# Patient Record
Sex: Female | Born: 1945 | Hispanic: No | Smoking: Never smoker
Health system: Southern US, Community
[De-identification: ages and names within clinical notes are randomized; demographics above are authoritative.]

## PROBLEM LIST (undated history)

## (undated) DIAGNOSIS — G309 Alzheimer's disease, unspecified: Secondary | ICD-10-CM

## (undated) DIAGNOSIS — F0280 Dementia in other diseases classified elsewhere without behavioral disturbance: Secondary | ICD-10-CM

## (undated) DIAGNOSIS — F0390 Unspecified dementia without behavioral disturbance: Secondary | ICD-10-CM

## (undated) HISTORY — PX: ABDOMINAL HYSTERECTOMY: SHX81

---

## 1999-08-24 ENCOUNTER — Ambulatory Visit (HOSPITAL_COMMUNITY): Admission: RE | Admit: 1999-08-24 | Discharge: 1999-08-24 | Payer: Self-pay | Admitting: Obstetrics & Gynecology

## 2005-08-27 ENCOUNTER — Emergency Department (HOSPITAL_COMMUNITY): Admission: EM | Admit: 2005-08-27 | Discharge: 2005-08-27 | Payer: Self-pay | Admitting: Family Medicine

## 2011-02-28 ENCOUNTER — Other Ambulatory Visit: Payer: Self-pay | Admitting: Internal Medicine

## 2011-02-28 DIAGNOSIS — Z1231 Encounter for screening mammogram for malignant neoplasm of breast: Secondary | ICD-10-CM

## 2011-03-10 ENCOUNTER — Ambulatory Visit
Admission: RE | Admit: 2011-03-10 | Discharge: 2011-03-10 | Disposition: A | Discharge status: Home / Self Care | Payer: Medicare Other | Source: Ambulatory Visit | Attending: Internal Medicine | Admitting: Internal Medicine

## 2011-03-10 DIAGNOSIS — Z1231 Encounter for screening mammogram for malignant neoplasm of breast: Secondary | ICD-10-CM

## 2011-03-14 ENCOUNTER — Other Ambulatory Visit: Payer: Self-pay | Admitting: Internal Medicine

## 2011-03-14 DIAGNOSIS — R928 Other abnormal and inconclusive findings on diagnostic imaging of breast: Secondary | ICD-10-CM

## 2011-03-17 ENCOUNTER — Ambulatory Visit
Admission: RE | Admit: 2011-03-17 | Discharge: 2011-03-17 | Disposition: A | Discharge status: Home / Self Care | Payer: Medicare Other | Source: Ambulatory Visit | Attending: Internal Medicine | Admitting: Internal Medicine

## 2011-03-17 DIAGNOSIS — R928 Other abnormal and inconclusive findings on diagnostic imaging of breast: Secondary | ICD-10-CM

## 2011-08-24 ENCOUNTER — Other Ambulatory Visit: Payer: Self-pay | Admitting: Internal Medicine

## 2011-08-24 DIAGNOSIS — N63 Unspecified lump in unspecified breast: Secondary | ICD-10-CM

## 2011-09-06 ENCOUNTER — Ambulatory Visit
Admission: RE | Admit: 2011-09-06 | Discharge: 2011-09-06 | Disposition: A | Discharge status: Home / Self Care | Payer: Medicare Other | Source: Ambulatory Visit | Attending: Internal Medicine | Admitting: Internal Medicine

## 2011-09-06 DIAGNOSIS — N63 Unspecified lump in unspecified breast: Secondary | ICD-10-CM

## 2013-02-02 ENCOUNTER — Emergency Department (HOSPITAL_COMMUNITY): Payer: Medicare Other

## 2013-02-02 ENCOUNTER — Inpatient Hospital Stay (HOSPITAL_COMMUNITY)
Admission: EM | Admit: 2013-02-02 | Discharge: 2013-02-05 | DRG: 510 | Disposition: A | Discharge status: Skilled Nursing Facility | Payer: Medicare Other | Attending: General Surgery | Admitting: General Surgery

## 2013-02-02 ENCOUNTER — Encounter (HOSPITAL_COMMUNITY): Payer: Self-pay | Admitting: *Deleted

## 2013-02-02 DIAGNOSIS — S0083XA Contusion of other part of head, initial encounter: Secondary | ICD-10-CM

## 2013-02-02 DIAGNOSIS — S62109A Fracture of unspecified carpal bone, unspecified wrist, initial encounter for closed fracture: Secondary | ICD-10-CM

## 2013-02-02 DIAGNOSIS — F0390 Unspecified dementia without behavioral disturbance: Secondary | ICD-10-CM | POA: Diagnosis present

## 2013-02-02 DIAGNOSIS — S065X9A Traumatic subdural hemorrhage with loss of consciousness of unspecified duration, initial encounter: Secondary | ICD-10-CM

## 2013-02-02 DIAGNOSIS — S069X9A Unspecified intracranial injury with loss of consciousness of unspecified duration, initial encounter: Secondary | ICD-10-CM

## 2013-02-02 DIAGNOSIS — Y92009 Unspecified place in unspecified non-institutional (private) residence as the place of occurrence of the external cause: Secondary | ICD-10-CM

## 2013-02-02 DIAGNOSIS — S52501A Unspecified fracture of the lower end of right radius, initial encounter for closed fracture: Secondary | ICD-10-CM

## 2013-02-02 DIAGNOSIS — S62102A Fracture of unspecified carpal bone, left wrist, initial encounter for closed fracture: Secondary | ICD-10-CM

## 2013-02-02 DIAGNOSIS — S065X0A Traumatic subdural hemorrhage without loss of consciousness, initial encounter: Secondary | ICD-10-CM | POA: Diagnosis present

## 2013-02-02 DIAGNOSIS — S0181XA Laceration without foreign body of other part of head, initial encounter: Secondary | ICD-10-CM

## 2013-02-02 DIAGNOSIS — W108XXA Fall (on) (from) other stairs and steps, initial encounter: Secondary | ICD-10-CM | POA: Diagnosis present

## 2013-02-02 DIAGNOSIS — S52502A Unspecified fracture of the lower end of left radius, initial encounter for closed fracture: Secondary | ICD-10-CM

## 2013-02-02 DIAGNOSIS — S62101A Fracture of unspecified carpal bone, right wrist, initial encounter for closed fracture: Secondary | ICD-10-CM

## 2013-02-02 DIAGNOSIS — S066X9A Traumatic subarachnoid hemorrhage with loss of consciousness of unspecified duration, initial encounter: Secondary | ICD-10-CM

## 2013-02-02 DIAGNOSIS — S0990XA Unspecified injury of head, initial encounter: Secondary | ICD-10-CM

## 2013-02-02 LAB — CBC WITH DIFFERENTIAL/PLATELET
Hemoglobin: 15.3 g/dL — ABNORMAL HIGH (ref 12.0–15.0)
Lymphocytes Relative: 5 % — ABNORMAL LOW (ref 12–46)
Lymphs Abs: 0.9 10*3/uL (ref 0.7–4.0)
MCH: 31.4 pg (ref 26.0–34.0)
Monocytes Relative: 5 % (ref 3–12)
Neutro Abs: 16.8 10*3/uL — ABNORMAL HIGH (ref 1.7–7.7)
Neutrophils Relative %: 90 % — ABNORMAL HIGH (ref 43–77)
Platelets: 282 10*3/uL (ref 150–400)
RBC: 4.88 MIL/uL (ref 3.87–5.11)
WBC: 18.8 10*3/uL — ABNORMAL HIGH (ref 4.0–10.5)

## 2013-02-02 LAB — MRSA PCR SCREENING: MRSA by PCR: NEGATIVE

## 2013-02-02 LAB — BASIC METABOLIC PANEL
BUN: 10 mg/dL (ref 6–23)
CO2: 29 mEq/L (ref 19–32)
Chloride: 101 mEq/L (ref 96–112)
GFR calc non Af Amer: 87 mL/min — ABNORMAL LOW (ref >90)
Glucose, Bld: 145 mg/dL — ABNORMAL HIGH (ref 70–99)
Potassium: 4.1 mEq/L (ref 3.5–5.1)
Sodium: 140 mEq/L (ref 135–145)

## 2013-02-02 MED ORDER — PANTOPRAZOLE SODIUM 40 MG IV SOLR
40.0000 mg | Freq: Every day | INTRAVENOUS | Status: DC
  Filled 2013-02-02 (×2): qty 40

## 2013-02-02 MED ORDER — ONDANSETRON HCL 4 MG/2ML IJ SOLN: 4.0000 mg | Freq: Four times a day (QID) | INTRAMUSCULAR | Status: DC | PRN

## 2013-02-02 MED ORDER — BUPIVACAINE HCL (PF) 0.5 % IJ SOLN
30.0000 mL | Freq: Once | INTRAMUSCULAR | Status: DC
  Filled 2013-02-02: qty 10

## 2013-02-02 MED ORDER — HALOPERIDOL LACTATE 5 MG/ML IJ SOLN
INTRAMUSCULAR | Status: AC
  Administered 2013-02-02: 5 mg via INTRAVENOUS
  Filled 2013-02-02: qty 1

## 2013-02-02 MED ORDER — HYDROCODONE-ACETAMINOPHEN 5-325 MG PO TABS: 1.0000 | ORAL_TABLET | ORAL | Status: DC | PRN

## 2013-02-02 MED ORDER — HALOPERIDOL LACTATE 5 MG/ML IJ SOLN
5.0000 mg | Freq: Once | INTRAMUSCULAR | Status: AC
  Administered 2013-02-02: 5 mg via INTRAVENOUS

## 2013-02-02 MED ORDER — ONDANSETRON HCL 4 MG PO TABS: 4.0000 mg | ORAL_TABLET | Freq: Four times a day (QID) | ORAL | Status: DC | PRN

## 2013-02-02 MED ORDER — ALPRAZOLAM 0.25 MG PO TABS: 0.2500 mg | ORAL_TABLET | Freq: Every evening | ORAL | Status: DC | PRN

## 2013-02-02 MED ORDER — MORPHINE SULFATE 2 MG/ML IJ SOLN: 0.5000 mg | INTRAMUSCULAR | Status: DC | PRN

## 2013-02-02 MED ORDER — BISACODYL 10 MG RE SUPP: 10.0000 mg | Freq: Every day | RECTAL | Status: DC | PRN

## 2013-02-02 MED ORDER — BUPIVACAINE HCL 0.5 % IJ SOLN: 50.0000 mL | Freq: Once | INTRAMUSCULAR | Status: DC

## 2013-02-02 MED ORDER — ONDANSETRON HCL 4 MG/2ML IJ SOLN
4.0000 mg | Freq: Once | INTRAMUSCULAR | Status: AC
  Administered 2013-02-02: 4 mg via INTRAVENOUS
  Filled 2013-02-02: qty 2

## 2013-02-02 MED ORDER — TRAZODONE 25 MG HALF TABLET
25.0000 mg | ORAL_TABLET | Freq: Every evening | ORAL | Status: DC | PRN
  Filled 2013-02-02: qty 2

## 2013-02-02 MED ORDER — HALOPERIDOL LACTATE 5 MG/ML IJ SOLN: 2.0000 mg | Freq: Four times a day (QID) | INTRAMUSCULAR | Status: DC | PRN

## 2013-02-02 MED ORDER — HYDROMORPHONE HCL PF 1 MG/ML IJ SOLN
0.5000 mg | Freq: Once | INTRAMUSCULAR | Status: AC
  Administered 2013-02-02: 0.5 mg via INTRAVENOUS
  Filled 2013-02-02: qty 1

## 2013-02-02 MED ORDER — MEMANTINE HCL 10 MG PO TABS
10.0000 mg | ORAL_TABLET | Freq: Two times a day (BID) | ORAL | Status: DC
  Administered 2013-02-02 – 2013-02-05 (×6): 10 mg via ORAL
  Filled 2013-02-02 (×8): qty 1

## 2013-02-02 MED ORDER — KCL IN DEXTROSE-NACL 20-5-0.45 MEQ/L-%-% IV SOLN
INTRAVENOUS | Status: DC
  Administered 2013-02-02 – 2013-02-03 (×2): via INTRAVENOUS
  Filled 2013-02-02 (×3): qty 1000

## 2013-02-02 MED ORDER — HYDROCODONE-ACETAMINOPHEN 5-325 MG PO TABS
2.0000 | ORAL_TABLET | ORAL | Status: DC | PRN
  Administered 2013-02-03: 2 via ORAL
  Filled 2013-02-02 (×2): qty 1

## 2013-02-02 MED ORDER — PANTOPRAZOLE SODIUM 40 MG PO TBEC
40.0000 mg | DELAYED_RELEASE_TABLET | Freq: Every day | ORAL | Status: DC
  Administered 2013-02-02 – 2013-02-03 (×2): 40 mg via ORAL
  Filled 2013-02-02 (×2): qty 1

## 2013-02-02 MED ORDER — DONEPEZIL HCL 10 MG PO TABS
10.0000 mg | ORAL_TABLET | Freq: Every day | ORAL | Status: DC
  Administered 2013-02-02 – 2013-02-04 (×3): 10 mg via ORAL
  Filled 2013-02-02 (×5): qty 1

## 2013-02-02 MED ORDER — DOCUSATE SODIUM 100 MG PO CAPS
100.0000 mg | ORAL_CAPSULE | Freq: Two times a day (BID) | ORAL | Status: DC
  Administered 2013-02-02 – 2013-02-05 (×6): 100 mg via ORAL
  Filled 2013-02-02 (×7): qty 1

## 2013-02-02 MED ORDER — ACETAMINOPHEN 325 MG PO TABS: 650.0000 mg | ORAL_TABLET | ORAL | Status: DC | PRN

## 2013-02-02 NOTE — ED Notes (Signed)
MD at bedside suturing pt.

## 2013-02-02 NOTE — Progress Notes (Signed)
S:  Pt s/p fall with head/facial trauma.  Pt with displaced bilateral distal radius fractures - currently in splints  O:Blood pressure 129/64, pulse 93, temperature 97.8 F (36.6 C), temperature source Oral, resp. rate 14, SpO2 100.00%. No results found for this or any previous visit.  Bilateral wrists with closed deformities, n/v intact bilaterally   A:  Bilateral distal radius fx's   P:  Pt will require operative (closed vs open) intervention when medically cleared. Please call me when pt is cleared for surgery.

## 2013-02-02 NOTE — Consult Note (Signed)
Crystal Davila is an 67 y.o. female.  Chief Complaint:  Fall, TBI, B wrist fractures  HPI:  Pt unable to fully describe. According to ED MD, pt fell down stairs while at assisted living facility. She tried to break her fall with her hands and has bilateral wrist deformities. She did strike her head. No LOC was noted. She was found to have small subdural hemorrhage. She has dementia, but no other significant medical history.  History reviewed. No pertinent past medical history.  History reviewed. No pertinent past surgical history.  History reviewed. No pertinent family history.  Social History: reports that she has never smoked. She has never used smokeless tobacco. She reports that she does not drink alcohol or use illicit drugs.  Allergies: No Known Allergies  MedicationsLong-Term  Prescriptions  Show Facility-Administered Medications     ALPRAZolam (XANAX) 0.25 MG tablet    donepezil (ARICEPT) 10 MG tablet    memantine (NAMENDA) 10 MG tablet    traZODone (DESYREL) 50 MG tablet    Results for orders placed during the hospital encounter of 02/02/13 (from the past 48 hour(s))   CBC WITH DIFFERENTIAL Status: Abnormal    Collection Time    02/02/13 5:29 AM   Result  Value  Range    WBC  18.8 (*)  4.0 - 10.5 K/uL    RBC  4.88  3.87 - 5.11 MIL/uL    Hemoglobin  15.3 (*)  12.0 - 15.0 g/dL    HCT  16.1  09.6 - 04.5 %    MCV  90.8  78.0 - 100.0 fL    MCH  31.4  26.0 - 34.0 pg    MCHC  34.5  30.0 - 36.0 g/dL    RDW  40.9  81.1 - 91.4 %    Platelets  282  150 - 400 K/uL    Neutrophils Relative  90 (*)  43 - 77 %    Neutro Abs  16.8 (*)  1.7 - 7.7 K/uL    Lymphocytes Relative  5 (*)  12 - 46 %    Lymphs Abs  0.9  0.7 - 4.0 K/uL    Monocytes Relative  5  3 - 12 %    Monocytes Absolute  1.0  0.1 - 1.0 K/uL    Eosinophils Relative  0  0 - 5 %    Eosinophils Absolute  0.0  0.0 - 0.7 K/uL    Basophils Relative  0  0 - 1 %    Basophils Absolute  0.0  0.0 - 0.1 K/uL   BASIC METABOLIC PANEL  Status: Abnormal    Collection Time    02/02/13 5:29 AM   Result  Value  Range    Sodium  140  135 - 145 mEq/L    Potassium  4.1  3.5 - 5.1 mEq/L    Chloride  101  96 - 112 mEq/L    CO2  29  19 - 32 mEq/L    Glucose, Bld  145 (*)  70 - 99 mg/dL    BUN  10  6 - 23 mg/dL    Creatinine, Ser  7.82  0.50 - 1.10 mg/dL    Calcium  9.6  8.4 - 10.5 mg/dL    GFR calc non Af Amer  87 (*)  >90 mL/min    GFR calc Af Amer  >90  >90 mL/min    Comment:      The eGFR has been calculated  using the CKD EPI equation.     This calculation has not been     validated in all clinical     situations.     eGFR's persistently     <90 mL/min signify     possible Chronic Kidney Disease.    Dg Wrist Complete Left  02/02/2013 *RADIOLOGY REPORT* Clinical Data: History of trauma from fall complaining of left sided wrist pain. LEFT WRIST - COMPLETE 3+ VIEW Comparison: No priors. Findings: Four views of the left wrist demonstrate a comminuted mildly impacted and mildly dorsally angulated (approximately 30 degrees) fracture through the distal radial metaphysis. Some of the views provided suggest the possibility of a nondisplaced fracture through the ulnar styloid, however, this is not definitive. Carpals themselves appear intact. Overlying soft tissues are markedly swollen. IMPRESSION: 1. Comminuted impacted mildly dorsally angulated fracture through the distal left radial metaphysis, as above. 2. Possible nondisplaced fracture through the ulnar styloid. Original Report Authenticated By: Trudie Reed, M.D.  Dg Wrist Complete Right  02/02/2013 *RADIOLOGY REPORT* Clinical Data: History of trauma from a fall complaining of right sided wrist pain. RIGHT WRIST - COMPLETE 3+ VIEW Comparison: No priors. Findings: Three views of the right wrist demonstrates a comminuted oblique fracture of the distal radial metadiaphysis which appears mildly impacted and dorsally angulated approximately 35 degrees. There is proximal migration of  the carpals relative to the distal ulna. The distal ulna appears intact. The carpals themselves appear intact. Overlying soft tissues are markedly swollen. IMPRESSION: 1. Severely comminuted, impacted and angulated leak fracture through the distal radial metadiaphysis, as above. Original Report Authenticated By: Trudie Reed, M.D.  Ct Head Wo Contrast  02/02/2013 *RADIOLOGY REPORT* Clinical Data: Status post fall; soft tissue hematoma over the left cheek, with laceration above the right eyebrow. Concern for head injury. CT HEAD WITHOUT CONTRAST CT MAXILLOFACIAL WITHOUT CONTRAST Technique: Multidetector CT imaging of the head and maxillofacial structures were performed using the standard protocol without intravenous contrast. Multiplanar CT image reconstructions of the maxillofacial structures were also generated. Comparison: None. CT HEAD Findings: There is a subdural hematoma overlying the right cerebral hemisphere, measuring up to 8 mm in thickness. A smaller subdural hematoma is noted along the right side of the anterior falx cerebri. No midline shift is seen. Trace subarachnoid blood is noted tracking along the right parieto- occipital region. No additional hemorrhage is identified. Scattered subcortical white matter change likely reflects small vessel ischemic microangiopathy. The posterior fossa, including the cerebellum, brainstem and fourth ventricle, is within normal limits. The third and lateral ventricles, and basal ganglia are unremarkable in appearance. There is no evidence of fracture; visualized osseous structures are unremarkable in appearance. The visualized portions of the orbits are within normal limits. The paranasal sinuses and mastoid air cells are well-aerated. A soft tissue laceration is noted overlying the right frontal calvarium, with soft tissue swelling extending inferior to the right orbit. IMPRESSION: 1. 8 mm subdural hematoma noted overlying the right cerebral hemisphere; smaller  subdural hematoma noted along the right side of the anterior falx cerebri. No midline shift seen. 2. Trace subarachnoid blood noted tracking along the right parieto- occipital region. 3. Scattered small vessel ischemic microangiopathy. 4. Soft tissue laceration overlying the right frontal calvarium, with soft tissue swelling extending inferior to the right orbit. CT MAXILLOFACIAL Findings: There is no evidence of fracture or dislocation. The maxilla and mandible appear intact. The nasal bone is unremarkable in appearance. The visualized dentition demonstrates no acute abnormality. There is chronic absence of  multiple maxillary and mandibular teeth. The orbits are intact bilaterally. The visualized paranasal sinuses and mastoid air cells are well-aerated. Soft tissue swelling is noted overlying the right maxilla and about the right orbit, with a soft tissue laceration overlying the right frontal calvarium. The parapharyngeal fat planes are preserved. The nasopharynx, oropharynx and hypopharynx are unremarkable in appearance. The visualized portions of the valleculae and piriform sinuses are grossly unremarkable. Calcification is noted at the carotid bifurcations bilaterally. The parotid and submandibular glands are within normal limits. No cervical lymphadenopathy is seen. IMPRESSION: 1. No evidence of fracture or dislocation. 2. Soft tissue swelling overlying the right maxilla and about the right orbit, with a soft tissue laceration overlying the right frontal calvarium. 3. Calcification noted at the carotid bifurcations bilaterally. Critical Value/emergent results were called by telephone at the time of interpretation on 02/02/2013 at 06:32 a.m. to Dr. Jones Skene, who verbally acknowledged these results. Original Report Authenticated By: Tonia Ghent, M.D.  Dg Chest Port 1 View  02/02/2013 *RADIOLOGY REPORT* Clinical Data: Post fall, trauma, confusion PORTABLE CHEST - 1 VIEW Comparison: None. Findings:  Borderline enlarged cardiac silhouette and mediastinal contours, possibly accentuated due to decreased lung volumes and AP projection. Minimal bibasilar opacities, favored to represent atelectasis. No definite pleural effusion or pneumothorax. No definite evidence of pulmonary edema. A presumed granuloma overlies the medial aspect the right upper lung. No acute osseous abnormalities. IMPRESSION: Bibasilar atelectasis suspected on this hypoventilated AP examination without definite acute cardiopulmonary disease. Further evaluation with a PA and lateral chest radiograph may be obtained as clinically indicated. Original Report Authenticated By: Tacey Ruiz, MD  Dg Hand Complete Left  02/02/2013 *RADIOLOGY REPORT* Clinical Data: History of trauma from fall complaining of left sided wrist pain. LEFT HAND - COMPLETE 3+ VIEW Comparison: No priors. Findings: Three views of the left wrist demonstrate a comminuted impacted fracture of the distal left radial metaphysis which appears mildly dorsally angulated (approximately 30 degrees). Overlying soft tissues are markedly swollen. There is a suggestion of a nondisplaced fracture through the ulnar styloid, however, this is not definitive. The carpals themselves appear intact. The remainder of the hand also appears intact, without additional acute fractures or dislocations. There is multifocal joint space narrowing, subchondral sclerosis, subchondral cyst formation and osteophyte formation, most prevalent in the DIP and PIP joints, as well as the first [facility] joint, compatible with advanced osteoarthritis. IMPRESSION: 1. No acute radiographic abnormality of the left hand. 2. Comminuted, impacted and mildly angulated fracture of the distal left radial metaphysis redemonstrated, as above. 3. Findings are suspicious but not definitive for a nondisplaced fracture of the ulnar styloid. 4. Degenerative changes of osteoarthritis, as above. Original Report Authenticated By: Trudie Reed, M.D.  Dg Hand Complete Right  02/02/2013 *RADIOLOGY REPORT* Clinical Data: History of fall complaining of right-sided hand and wrist pain. RIGHT HAND - COMPLETE 3+ VIEW Comparison: No priors. Findings: Three views of the right hand redemonstrate the presence of a highly comminuted oblique fracture through the distal radial metadiaphysis which appears impacted and dorsally angulated approximately 35 degrees. The hand itself appears intact, without additional definite fracture, subluxation or dislocation. There is multifocal joint space narrowing, subchondral sclerosis, subchondral cyst formation and osteophyte formation, most pronounced in the DIP and PIP joints, as well as the first [facility] joint, compatible with advanced osteoarthritis. IMPRESSION: 1. No acute radiographic abnormality of the right hand. 2. Comminuted, impacted, dorsally angulated oblique fracture through the distal right radial metadiaphysis redemonstrated. 3. Advanced changes of osteoarthritis,  as above. Original Report Authenticated By: Trudie Reed, M.D.  Ct Maxillofacial Wo Cm  02/02/2013 *RADIOLOGY REPORT* Clinical Data: Status post fall; soft tissue hematoma over the left cheek, with laceration above the right eyebrow. Concern for head injury. CT HEAD WITHOUT CONTRAST CT MAXILLOFACIAL WITHOUT CONTRAST Technique: Multidetector CT imaging of the head and maxillofacial structures were performed using the standard protocol without intravenous contrast. Multiplanar CT image reconstructions of the maxillofacial structures were also generated. Comparison: None. CT HEAD Findings: There is a subdural hematoma overlying the right cerebral hemisphere, measuring up to 8 mm in thickness. A smaller subdural hematoma is noted along the right side of the anterior falx cerebri. No midline shift is seen. Trace subarachnoid blood is noted tracking along the right parieto- occipital region. No additional hemorrhage is identified. Scattered subcortical  white matter change likely reflects small vessel ischemic microangiopathy. The posterior fossa, including the cerebellum, brainstem and fourth ventricle, is within normal limits. The third and lateral ventricles, and basal ganglia are unremarkable in appearance. There is no evidence of fracture; visualized osseous structures are unremarkable in appearance. The visualized portions of the orbits are within normal limits. The paranasal sinuses and mastoid air cells are well-aerated. A soft tissue laceration is noted overlying the right frontal calvarium, with soft tissue swelling extending inferior to the right orbit. IMPRESSION: 1. 8 mm subdural hematoma noted overlying the right cerebral hemisphere; smaller subdural hematoma noted along the right side of the anterior falx cerebri. No midline shift seen. 2. Trace subarachnoid blood noted tracking along the right parieto- occipital region. 3. Scattered small vessel ischemic microangiopathy. 4. Soft tissue laceration overlying the right frontal calvarium, with soft tissue swelling extending inferior to the right orbit. CT MAXILLOFACIAL Findings: There is no evidence of fracture or dislocation. The maxilla and mandible appear intact. The nasal bone is unremarkable in appearance. The visualized dentition demonstrates no acute abnormality. 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 The orbits are intact bilaterally. The visualized paranasal sinuses and mastoid air cells are well-aerated. Soft tissue swelling is noted overlying the right maxilla and about the right orbit, with a soft tissue laceration overlying the right frontal calvarium. The parapharyngeal fat planes are preserved. The nasopharynx, oropharynx and hypopharynx are unremarkable in appearance. The visualized portions of the valleculae and piriform sinuses are grossly unremarkable. Calcification is noted at the carotid bifurcations bilaterally. The parotid and submandibular glands are  within normal limits. No cervical lymphadenopathy is seen. IMPRESSION: 1. No evidence of fracture or dislocation. 2. Soft tissue swelling overlying the right maxilla and about the right orbit, with a soft tissue laceration overlying the right frontal calvarium. 3. Calcification noted at the carotid bifurcations bilaterally. Critical Value/emergent results were called by telephone at the time of interpretation on 02/02/2013 at 06:32 a.m. to Dr. Jones Skene, who verbally acknowledged these results. Original Report Authenticated By: Tonia Ghent, M.D.   Review of Systems  Unable to perform ROS: dementia   Blood pressure 105/87, pulse 95, temperature 97.8 F (36.6 C), temperature source Oral, resp. rate 16, SpO2 87.00%.  Physical Exam  Constitutional: She appears well-developed and well-nourished. No distress.  HENT:  Head: Normocephalic.  Right Ear: External ear normal.  Left Ear: External ear normal.  Laceration 4 cm on right frontotemporal area. ED MD suturing.  Eyes: Conjunctivae are normal. Pupils are equal, round, and reactive to light. No scleral icterus.  Neck: Normal range of motion. Neck supple. No JVD present. No tracheal deviation present. No thyromegaly  present.  Cardiovascular: Normal rate, regular rhythm and intact distal pulses. Exam reveals no gallop and no friction rub.  No murmur heard.  Respiratory: Effort normal and breath sounds normal. No stridor. No respiratory distress. She has no wheezes. She has no rales. She exhibits no tenderness.  GI: Soft. Bowel sounds are normal. She exhibits no distension and no mass. There is no tenderness. There is no rebound and no guarding.  Musculoskeletal: Normal range of motion. She exhibits tenderness (B wrists). She exhibits no edema.  Bilateral wrist deformities. +motor/sensation grossly on hand/fingers. Good cap refill on fingers  Lymphadenopathy:  She has no cervical adenopathy.  Neurological: She is alert. She exhibits normal  muscle tone.  Eyes open. Able to respond to simple questions in reasonable amount of time. Responds to her name. Skin: Skin is warm and dry. No rash noted. She is not diaphoretic. No erythema. No pallor.  Psychiatric:  Pt received Haldol, was combattive earlier.  Patient is oriented to name with prompting, has no idea how old she is, where she is or what happened to her.  Speech is elliptical and she laughs when she doesn't know the correct answer.  Pleasant after haldol without agitation.   Assessment/Plan  Fall- Falls risk  TBI - ICU admit. Repeat head CT tomorrow.Neuro checks. Unclear how much of mental status is baseline vs head injury. I think her cognitive performance at this point relates to her baseline dementia rather than her head injury. B wrist fractures - hand surgery to evaluate. Will need to be sedated to set.  Dementia - stay on home meds. OK for brief sedation for wrist fractures, given subdural hematoma.  Will need family discussion as to level of aggressiveness of care if deteriorates.  She is unlikely to need surgery on subdural at this point. Observe with frequent neuro checks in ICU setting.  Dorian Heckle, MD 02/02/2013, 7:52 AM

## 2013-02-02 NOTE — ED Notes (Signed)
Per EMS: pt coming from [facility] with c/o of fall. Pt's friend found pt on the stairs. Pt does not remember the fall, unknown LOC, laceration above right eye brow with bleeding controlled, no blood thinners, hematoma to right eye, pain in bilateral wrist. EMS reports pt's memory comes and goes. Pt denies C-spine, thoracic, or lumbar pain.

## 2013-02-02 NOTE — ED Notes (Signed)
Pt family contact  Abeeha Twist- [phone]

## 2013-02-02 NOTE — ED Provider Notes (Signed)
History     CSN: [id number]  Arrival date & time 02/02/13  0424   First MD Initiated Contact with Patient 02/02/13 [phone]      Chief Complaint  Patient presents with  . Fall    (Consider location/radiation/quality/duration/timing/severity/associated sxs/prior treatment) HPI Crystal Davila is a 67 y.o. female with Alzheimer's dementia who is a resident at [facility] presents with bilateral wrist pain and face pain.  Intermittently, she says she can remember and does not remember her fall. To me, she remarks that she fell about 5 stairs onto both outstretched hands and then hit the right side of her face on the ground. She denies any loss of consciousness. She denies any other pain. Most of her pain is located in bilateral wrists which are visibly deformed. Patient's pain is throbbing, 8/10, constant, not relieved by anything. No other alleviating or exacerbating factors and no other associated symptoms. She denies any numbness, tingling, weakness, chest pain, shortness of breath, recent illness, her sick contacts, dysuria. Patient says this is a simple trip on the stairs and did not get dizzy or lightheaded or short of breath prior to the incident.   History reviewed. No pertinent past medical history.  History reviewed. No pertinent past surgical history.  History reviewed. No pertinent family history.  History  Substance Use Topics  . Smoking status: Never Smoker   . Smokeless tobacco: Never Used  . Alcohol Use: No    OB History   Grav Para Term Preterm Abortions TAB SAB Ect Mult Living                  Review of Systems At least 10pt or greater review of systems completed and are negative except where specified in the HPI.  Allergies  Review of patient's allergies indicates no known allergies.  Home Medications   Current Outpatient Rx  Name  Route  Sig  Dispense  Refill  . ALPRAZolam (XANAX) 0.25 MG tablet   Oral   Take 0.25 mg by mouth at bedtime as needed for  sleep.         Marland Kitchen donepezil (ARICEPT) 10 MG tablet   Oral   Take 10 mg by mouth at bedtime as needed.         . memantine (NAMENDA) 10 MG tablet   Oral   Take 10 mg by mouth 2 (two) times daily.         . traZODone (DESYREL) 50 MG tablet   Oral   Take 25-50 mg by mouth at bedtime as needed for sleep.           BP 149/75  Pulse 94  Temp(Src) 97.8 F (36.6 C) (Oral)  Resp 16  SpO2 96%  Physical Exam  Nursing notes reviewed.  Electronic medical record reviewed. VITAL SIGNS:   Filed Vitals:   02/02/13 0434 02/02/13 0436  BP:  149/75  Pulse:  94  Temp:  97.8 F (36.6 C)  TempSrc:  Oral  Resp:  16  SpO2: 98% 96%   CONSTITUTIONAL: Awake, oriented, appears non-toxic HENT: Swelling and contusion to the right superior orbital rim with some minor eyelid swelling, there is a 2 cm laceration to the superior lateral aspect of the eyebrow. It is hemostatic. Normocephalic, oral mucosa pink and moist, airway patent. Nares patent without drainage. External ears normal. EYES: Conjunctiva clear, EOMI, PERRLA NECK: Trachea midline, non-tender, supple CARDIOVASCULAR: Normal heart rate, Normal rhythm, No murmurs, rubs, gallops PULMONARY/CHEST: Clear to auscultation, no rhonchi,  wheezes, or rales. Symmetrical breath sounds. Non-tender. ABDOMINAL: Non-distended, soft, non-tender - no rebound or guarding.  BS normal. NEUROLOGIC: Cranial nerves are normal. Non-focal, moving all four extremities, no gross sensory or motor deficits. EXTREMITIES: No clubbing, cyanosis, or edema. Bilateral wrist deformities and swelling at the distal radius and ulna suggestive of bilateral Colles fractures. Patient is neurovascularly intact distally SKIN: Warm, Dry, No erythema, No rash  ED Course  LACERATION REPAIR Date/Time: 02/02/2013 9:01 AM Performed by: Jones Skene Authorized by: Jones Skene Consent: Verbal consent obtained. Patient identity confirmed: verbally with patient Body area:  head/neck Location details: right eyebrow Laceration length: 2 cm Foreign bodies: no foreign bodies Tendon involvement: none Nerve involvement: none Vascular damage: no Anesthesia: local infiltration Local anesthetic: lidocaine 1% with epinephrine Anesthetic total: 4 ml Patient sedated: no Preparation: Patient was prepped and draped in the usual sterile fashion. Irrigation solution: saline Irrigation method: syringe (18g angiocath) Amount of cleaning: standard Debridement: minimal Degree of undermining: none Skin closure: 6-0 nylon Subcutaneous closure: 5-0 Vicryl Number of sutures: 7 Technique: simple and running Approximation: close Approximation difficulty: simple Patient tolerance: Patient tolerated the procedure well with no immediate complications.   (including critical care time)  Labs Reviewed  CBC WITH DIFFERENTIAL - Abnormal; Notable for the following:    WBC 18.8 (*)    Hemoglobin 15.3 (*)    Neutrophils Relative 90 (*)    Neutro Abs 16.8 (*)    Lymphocytes Relative 5 (*)    All other components within normal limits  BASIC METABOLIC PANEL - Abnormal; Notable for the following:    Glucose, Bld 145 (*)    GFR calc non Af Amer 87 (*)    All other components within normal limits   Dg Wrist Complete Left  02/02/2013  *RADIOLOGY REPORT*  Clinical Data: History of trauma from fall complaining of left sided wrist pain.  LEFT WRIST - COMPLETE 3+ VIEW  Comparison: No priors.  Findings: Four views of the left wrist demonstrate a comminuted mildly impacted and mildly dorsally angulated (approximately 30 degrees) fracture through the distal radial metaphysis.  Some of the views provided suggest the possibility of a nondisplaced fracture through the ulnar styloid, however, this is not definitive.  Carpals themselves appear intact.  Overlying soft tissues are markedly swollen.  IMPRESSION: 1.  Comminuted impacted mildly dorsally angulated fracture through the distal left radial  metaphysis, as above. 2.  Possible nondisplaced fracture through the ulnar styloid.   Original Report Authenticated By: Trudie Reed, M.D.    Dg Wrist Complete Right  02/02/2013  *RADIOLOGY REPORT*  Clinical Data: History of trauma from a fall complaining of right sided wrist pain.  RIGHT WRIST - COMPLETE 3+ VIEW  Comparison: No priors.  Findings: Three views of the right wrist demonstrates a comminuted oblique fracture of the distal radial metadiaphysis which appears mildly impacted and dorsally angulated approximately 35 degrees. There is proximal migration of the carpals relative to the distal ulna.  The distal ulna appears intact.  The carpals themselves appear intact.  Overlying soft tissues are markedly swollen.  IMPRESSION: 1.  Severely comminuted, impacted and angulated leak fracture through the distal radial metadiaphysis, as above.   Original Report Authenticated By: Trudie Reed, M.D.    Ct Head Wo Contrast  02/02/2013  *RADIOLOGY REPORT*  Clinical Data:  Status post fall; soft tissue hematoma over the left cheek, with laceration above the right eyebrow.  Concern for head injury.  CT HEAD WITHOUT CONTRAST CT MAXILLOFACIAL WITHOUT CONTRAST  Technique:  Multidetector CT imaging of the head and maxillofacial structures were performed using the standard protocol without intravenous contrast. Multiplanar CT image reconstructions of the maxillofacial structures were also generated.  Comparison:   None.  CT HEAD  Findings:   There is a subdural hematoma overlying the right cerebral hemisphere, measuring up to 8 mm in thickness.  A smaller subdural hematoma is noted along the right side of the anterior falx cerebri.  No midline shift is seen.  Trace subarachnoid blood is noted tracking along the right parieto- occipital region.  No additional hemorrhage is identified.  Scattered subcortical white matter change likely reflects small vessel ischemic microangiopathy.  The posterior fossa, including the  cerebellum, brainstem and fourth ventricle, is within normal limits.  The third and lateral ventricles, and basal ganglia are unremarkable in appearance.  There is no evidence of fracture; visualized osseous structures are unremarkable in appearance.  The visualized portions of the orbits are within normal limits.  The paranasal sinuses and mastoid air cells are well-aerated.  A soft tissue laceration is noted overlying the right frontal calvarium, with soft tissue swelling extending inferior to the right orbit.  IMPRESSION:  1.  8 mm subdural hematoma noted overlying the right cerebral hemisphere; smaller subdural hematoma noted along the right side of the anterior falx cerebri.  No midline shift seen. 2.  Trace subarachnoid blood noted tracking along the right parieto- occipital region. 3.  Scattered small vessel ischemic microangiopathy. 4.  Soft tissue laceration overlying the right frontal calvarium, with soft tissue swelling extending inferior to the right orbit.  CT MAXILLOFACIAL  Findings:   There is no evidence of fracture or dislocation.  The maxilla and mandible appear intact.  The nasal bone is unremarkable in appearance.  The visualized dentition demonstrates no acute abnormality.  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  The orbits are intact bilaterally.  The visualized paranasal sinuses and mastoid air cells are well-aerated.  Soft tissue swelling is noted overlying the right maxilla and about the right orbit, with a soft tissue laceration overlying the right frontal calvarium.  The parapharyngeal fat planes are preserved. The nasopharynx, oropharynx and hypopharynx are unremarkable in appearance.  The visualized portions of the valleculae and piriform sinuses are grossly unremarkable.  Calcification is noted at the carotid bifurcations bilaterally.  The parotid and submandibular glands are within normal limits.  No cervical lymphadenopathy is seen.  IMPRESSION:  1.  No  evidence of fracture or dislocation. 2.  Soft tissue swelling overlying the right maxilla and about the right orbit, with a soft tissue laceration overlying the right frontal calvarium. 3.  Calcification noted at the carotid bifurcations bilaterally.  Critical Value/emergent results were called by telephone at the time of interpretation on 02/02/2013 at 06:32 a.m. to Dr. Jones Skene, who verbally acknowledged these results.   Original Report Authenticated By: Tonia Ghent, M.D.    Dg Chest Port 1 View  02/02/2013  *RADIOLOGY REPORT*  Clinical Data: Post fall, trauma, confusion  PORTABLE CHEST - 1 VIEW  Comparison: None.  Findings:  Borderline enlarged cardiac silhouette and mediastinal contours, possibly accentuated due to decreased lung volumes and AP projection.  Minimal bibasilar opacities, favored to represent atelectasis.  No definite pleural effusion or pneumothorax.  No definite evidence of pulmonary edema. A presumed granuloma overlies the medial aspect the right upper lung.  No acute osseous abnormalities.  IMPRESSION: Bibasilar atelectasis suspected on this hypoventilated AP examination without definite acute cardiopulmonary disease.  Further evaluation with a PA and lateral chest radiograph may be obtained as clinically indicated.   Original Report Authenticated By: Tacey Ruiz, MD    Dg Hand Complete Left  02/02/2013  *RADIOLOGY REPORT*  Clinical Data: History of trauma from fall complaining of left sided wrist pain.  LEFT HAND - COMPLETE 3+ VIEW  Comparison: No priors.  Findings: Three views of the left wrist demonstrate a comminuted impacted fracture of the distal left radial metaphysis which appears mildly dorsally angulated (approximately 30 degrees). Overlying soft tissues are markedly swollen.  There is a suggestion of a nondisplaced fracture through the ulnar styloid, however, this is not definitive.  The carpals themselves appear intact. The remainder of the hand also appears intact,  without additional acute fractures or dislocations.  There is multifocal joint space narrowing, subchondral sclerosis, subchondral cyst formation and osteophyte formation, most prevalent in the DIP and PIP joints, as well as the first [facility] joint, compatible with advanced osteoarthritis.  IMPRESSION: 1. No acute radiographic abnormality of the left hand. 2.  Comminuted, impacted and mildly angulated fracture of the distal left radial metaphysis redemonstrated, as above. 3.  Findings are suspicious but not definitive for a nondisplaced fracture of the ulnar styloid. 4.  Degenerative changes of osteoarthritis, as above.   Original Report Authenticated By: Trudie Reed, M.D.    Dg Hand Complete Right  02/02/2013  *RADIOLOGY REPORT*  Clinical Data: History of fall complaining of right-sided hand and wrist pain.  RIGHT HAND - COMPLETE 3+ VIEW  Comparison: No priors.  Findings: Three views of the right hand redemonstrate the presence of a highly comminuted oblique fracture through the distal radial metadiaphysis which appears impacted and dorsally angulated approximately 35 degrees.  The hand itself appears intact, without additional definite fracture, subluxation or dislocation.  There is multifocal joint space narrowing, subchondral sclerosis, subchondral cyst formation and osteophyte formation, most pronounced in the DIP and PIP joints, as well as the first [facility] joint, compatible with advanced osteoarthritis.  IMPRESSION: 1.  No acute radiographic abnormality of the right hand. 2.  Comminuted, impacted, dorsally angulated oblique fracture through the distal right radial metadiaphysis redemonstrated. 3.  Advanced changes of osteoarthritis, as above.   Original Report Authenticated By: Trudie Reed, M.D.    Ct Maxillofacial Wo Cm  02/02/2013  *RADIOLOGY REPORT*  Clinical Data:  Status post fall; soft tissue hematoma over the left cheek, with laceration above the right eyebrow.  Concern for head injury.  CT HEAD  WITHOUT CONTRAST CT MAXILLOFACIAL WITHOUT CONTRAST  Technique:  Multidetector CT imaging of the head and maxillofacial structures were performed using the standard protocol without intravenous contrast. Multiplanar CT image reconstructions of the maxillofacial structures were also generated.  Comparison:   None.  CT HEAD  Findings:   There is a subdural hematoma overlying the right cerebral hemisphere, measuring up to 8 mm in thickness.  A smaller subdural hematoma is noted along the right side of the anterior falx cerebri.  No midline shift is seen.  Trace subarachnoid blood is noted tracking along the right parieto- occipital region.  No additional hemorrhage is identified.  Scattered subcortical white matter change likely reflects small vessel ischemic microangiopathy.  The posterior fossa, including the cerebellum, brainstem and fourth ventricle, is within normal limits.  The third and lateral ventricles, and basal ganglia are unremarkable in appearance.  There is no evidence of fracture; visualized osseous structures are unremarkable in appearance.  The visualized portions of the orbits are within normal  limits.  The paranasal sinuses and mastoid air cells are well-aerated.  A soft tissue laceration is noted overlying the right frontal calvarium, with soft tissue swelling extending inferior to the right orbit.  IMPRESSION:  1.  8 mm subdural hematoma noted overlying the right cerebral hemisphere; smaller subdural hematoma noted along the right side of the anterior falx cerebri.  No midline shift seen. 2.  Trace subarachnoid blood noted tracking along the right parieto- occipital region. 3.  Scattered small vessel ischemic microangiopathy. 4.  Soft tissue laceration overlying the right frontal calvarium, with soft tissue swelling extending inferior to the right orbit.  CT MAXILLOFACIAL  Findings:   There is no evidence of fracture or dislocation.  The maxilla and mandible appear intact.  The nasal bone is  unremarkable in appearance.  The visualized dentition demonstrates no acute abnormality.  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  The orbits are intact bilaterally.  The visualized paranasal sinuses and mastoid air cells are well-aerated.  Soft tissue swelling is noted overlying the right maxilla and about the right orbit, with a soft tissue laceration overlying the right frontal calvarium.  The parapharyngeal fat planes are preserved. The nasopharynx, oropharynx and hypopharynx are unremarkable in appearance.  The visualized portions of the valleculae and piriform sinuses are grossly unremarkable.  Calcification is noted at the carotid bifurcations bilaterally.  The parotid and submandibular glands are within normal limits.  No cervical lymphadenopathy is seen.  IMPRESSION:  1.  No evidence of fracture or dislocation. 2.  Soft tissue swelling overlying the right maxilla and about the right orbit, with a soft tissue laceration overlying the right frontal calvarium. 3.  Calcification noted at the carotid bifurcations bilaterally.  Critical Value/emergent results were called by telephone at the time of interpretation on 02/02/2013 at 06:32 a.m. to Dr. Jones Skene, who verbally acknowledged these results.   Original Report Authenticated By: Tonia Ghent, M.D.      1. Fall at home, initial encounter   2. Distal radius fracture, left, closed, initial encounter   3. Distal radius fracture, right, closed, initial encounter   4. Head injury, acute, initial encounter   5. Facial laceration, initial encounter   6. Subdural hematoma, acute   7. Subarachnoid hemorrhage following injury, initial encounter   8. Facial contusion, initial encounter       MDM  Crystal Davila is a 67 y.o. female who is presenting with a fall, bilateral distal radial fractures right worse than left. Patient is also demented, some mild altered mental status-not sure exactly what her baseline is.  She  is sitting upright in bed, she's claim of no neck pain she is turning her neck freely rotating left and right flexion and extension without pain. At this point, she has not had C-spine collar and she is showing absolutely no signs of cervical fracture or ligamentous injury -I do not think she really needs a CT of the neck. Patient moving all 4 extremities and does not complain about any numbness tingling or weakness. Her neurologic exam is normal with the section of some mild hand weakness which is secondary to severe dorsally angulated distal radius fractures.    CT of the head does show a right-sided subdural hemorrhage 8 mm, another smaller subdural along the right anterior false, there is no midline shift, skeletal subarachnoid blood in the right parietal-occipital region as well. No max face fractures-laceration to the supraorbital ridges noted.  She has bilateral distal radial fractures -  splinted by ortho tech  D/W Dr Izora Ribas - place in loose splints prior to reduction - Dr. Izora Ribas  D/W Dr. Venetia Maxon - reviewed CT and thinks there is nothing surgically to do, he will evaluate pt and likely clear her for sedation for external reducution of radial fractures. D/W Dr. Donell Beers of [facility] for admission.  Jones Skene, MD 02/02/13 [phone]

## 2013-02-02 NOTE — H&P (Addendum)
Crystal Davila is an 67 y.o. female.   Chief Complaint:  Fall, TBI, B wrist fractures HPI:  Minimal history available to me.  Pt unable to fully describe.  According to ED MD, pt fell down stairs while at assisted living facility.  She tried to break her fall with her hands and has bilateral wrist deformities.  She did strike her head.  No LOC was noted.  She was found to have small subdural hemorrhage.  She has dementia, but no other significant medical history.    History reviewed. No pertinent past medical history.  History reviewed. No pertinent past surgical history.  History reviewed. No pertinent family history. Social History:  reports that she has never smoked. She has never used smokeless tobacco. She reports that she does not drink alcohol or use illicit drugs.  Allergies: No Known Allergies  MedicationsLong-Term  Prescriptions Show Facility-Administered Medications    ALPRAZolam (XANAX) 0.25 MG tablet   donepezil (ARICEPT) 10 MG tablet   memantine (NAMENDA) 10 MG tablet   traZODone (DESYREL) 50 MG tablet     Results for orders placed during the hospital encounter of 02/02/13 (from the past 48 hour(s))  CBC WITH DIFFERENTIAL     Status: Abnormal   Collection Time    02/02/13  5:29 AM      Result Value Range   WBC 18.8 (*) 4.0 - 10.5 K/uL   RBC 4.88  3.87 - 5.11 MIL/uL   Hemoglobin 15.3 (*) 12.0 - 15.0 g/dL   HCT 16.1  09.6 - 04.5 %   MCV 90.8  78.0 - 100.0 fL   MCH 31.4  26.0 - 34.0 pg   MCHC 34.5  30.0 - 36.0 g/dL   RDW 40.9  81.1 - 91.4 %   Platelets 282  150 - 400 K/uL   Neutrophils Relative 90 (*) 43 - 77 %   Neutro Abs 16.8 (*) 1.7 - 7.7 K/uL   Lymphocytes Relative 5 (*) 12 - 46 %   Lymphs Abs 0.9  0.7 - 4.0 K/uL   Monocytes Relative 5  3 - 12 %   Monocytes Absolute 1.0  0.1 - 1.0 K/uL   Eosinophils Relative 0  0 - 5 %   Eosinophils Absolute 0.0  0.0 - 0.7 K/uL   Basophils Relative 0  0 - 1 %   Basophils Absolute 0.0  0.0 - 0.1 K/uL  BASIC METABOLIC  PANEL     Status: Abnormal   Collection Time    02/02/13  5:29 AM      Result Value Range   Sodium 140  135 - 145 mEq/L   Potassium 4.1  3.5 - 5.1 mEq/L   Chloride 101  96 - 112 mEq/L   CO2 29  19 - 32 mEq/L   Glucose, Bld 145 (*) 70 - 99 mg/dL   BUN 10  6 - 23 mg/dL   Creatinine, Ser 7.82  0.50 - 1.10 mg/dL   Calcium 9.6  8.4 - 95.6 mg/dL   GFR calc non Af Amer 87 (*) >90 mL/min   GFR calc Af Amer >90  >90 mL/min   Comment:            The eGFR has been calculated     using the CKD EPI equation.     This calculation has not been     validated in all clinical     situations.     eGFR's persistently     <90 mL/min signify  possible Chronic Kidney Disease.   Dg Wrist Complete Left  02/02/2013  *RADIOLOGY REPORT*  Clinical Data: History of trauma from fall complaining of left sided wrist pain.  LEFT WRIST - COMPLETE 3+ VIEW  Comparison: No priors.  Findings: Four views of the left wrist demonstrate a comminuted mildly impacted and mildly dorsally angulated (approximately 30 degrees) fracture through the distal radial metaphysis.  Some of the views provided suggest the possibility of a nondisplaced fracture through the ulnar styloid, however, this is not definitive.  Carpals themselves appear intact.  Overlying soft tissues are markedly swollen.  IMPRESSION: 1.  Comminuted impacted mildly dorsally angulated fracture through the distal left radial metaphysis, as above. 2.  Possible nondisplaced fracture through the ulnar styloid.   Original Report Authenticated By: Trudie Reed, M.D.    Dg Wrist Complete Right  02/02/2013  *RADIOLOGY REPORT*  Clinical Data: History of trauma from a fall complaining of right sided wrist pain.  RIGHT WRIST - COMPLETE 3+ VIEW  Comparison: No priors.  Findings: Three views of the right wrist demonstrates a comminuted oblique fracture of the distal radial metadiaphysis which appears mildly impacted and dorsally angulated approximately 35 degrees. There is  proximal migration of the carpals relative to the distal ulna.  The distal ulna appears intact.  The carpals themselves appear intact.  Overlying soft tissues are markedly swollen.  IMPRESSION: 1.  Severely comminuted, impacted and angulated leak fracture through the distal radial metadiaphysis, as above.   Original Report Authenticated By: Trudie Reed, M.D.    Ct Head Wo Contrast  02/02/2013  *RADIOLOGY REPORT*  Clinical Data:  Status post fall; soft tissue hematoma over the left cheek, with laceration above the right eyebrow.  Concern for head injury.  CT HEAD WITHOUT CONTRAST CT MAXILLOFACIAL WITHOUT CONTRAST  Technique:  Multidetector CT imaging of the head and maxillofacial structures were performed using the standard protocol without intravenous contrast. Multiplanar CT image reconstructions of the maxillofacial structures were also generated.  Comparison:   None.  CT HEAD  Findings:   There is a subdural hematoma overlying the right cerebral hemisphere, measuring up to 8 mm in thickness.  A smaller subdural hematoma is noted along the right side of the anterior falx cerebri.  No midline shift is seen.  Trace subarachnoid blood is noted tracking along the right parieto- occipital region.  No additional hemorrhage is identified.  Scattered subcortical white matter change likely reflects small vessel ischemic microangiopathy.  The posterior fossa, including the cerebellum, brainstem and fourth ventricle, is within normal limits.  The third and lateral ventricles, and basal ganglia are unremarkable in appearance.  There is no evidence of fracture; visualized osseous structures are unremarkable in appearance.  The visualized portions of the orbits are within normal limits.  The paranasal sinuses and mastoid air cells are well-aerated.  A soft tissue laceration is noted overlying the right frontal calvarium, with soft tissue swelling extending inferior to the right orbit.  IMPRESSION:  1.  8 mm subdural  hematoma noted overlying the right cerebral hemisphere; smaller subdural hematoma noted along the right side of the anterior falx cerebri.  No midline shift seen. 2.  Trace subarachnoid blood noted tracking along the right parieto- occipital region. 3.  Scattered small vessel ischemic microangiopathy. 4.  Soft tissue laceration overlying the right frontal calvarium, with soft tissue swelling extending inferior to the right orbit.  CT MAXILLOFACIAL  Findings:   There is no evidence of fracture or dislocation.  The maxilla and mandible  appear intact.  The nasal bone is unremarkable in appearance.  The visualized dentition demonstrates no acute abnormality.  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  The orbits are intact bilaterally.  The visualized paranasal sinuses and mastoid air cells are well-aerated.  Soft tissue swelling is noted overlying the right maxilla and about the right orbit, with a soft tissue laceration overlying the right frontal calvarium.  The parapharyngeal fat planes are preserved. The nasopharynx, oropharynx and hypopharynx are unremarkable in appearance.  The visualized portions of the valleculae and piriform sinuses are grossly unremarkable.  Calcification is noted at the carotid bifurcations bilaterally.  The parotid and submandibular glands are within normal limits.  No cervical lymphadenopathy is seen.  IMPRESSION:  1.  No evidence of fracture or dislocation. 2.  Soft tissue swelling overlying the right maxilla and about the right orbit, with a soft tissue laceration overlying the right frontal calvarium. 3.  Calcification noted at the carotid bifurcations bilaterally.  Critical Value/emergent results were called by telephone at the time of interpretation on 02/02/2013 at 06:32 a.m. to Dr. Jones Skene, who verbally acknowledged these results.   Original Report Authenticated By: Tonia Ghent, M.D.    Dg Chest Port 1 View  02/02/2013  *RADIOLOGY REPORT*   Clinical Data: Post fall, trauma, confusion  PORTABLE CHEST - 1 VIEW  Comparison: None.  Findings:  Borderline enlarged cardiac silhouette and mediastinal contours, possibly accentuated due to decreased lung volumes and AP projection.  Minimal bibasilar opacities, favored to represent atelectasis.  No definite pleural effusion or pneumothorax.  No definite evidence of pulmonary edema. A presumed granuloma overlies the medial aspect the right upper lung.  No acute osseous abnormalities.  IMPRESSION: Bibasilar atelectasis suspected on this hypoventilated AP examination without definite acute cardiopulmonary disease. Further evaluation with a PA and lateral chest radiograph may be obtained as clinically indicated.   Original Report Authenticated By: Tacey Ruiz, MD    Dg Hand Complete Left  02/02/2013  *RADIOLOGY REPORT*  Clinical Data: History of trauma from fall complaining of left sided wrist pain.  LEFT HAND - COMPLETE 3+ VIEW  Comparison: No priors.  Findings: Three views of the left wrist demonstrate a comminuted impacted fracture of the distal left radial metaphysis which appears mildly dorsally angulated (approximately 30 degrees). Overlying soft tissues are markedly swollen.  There is a suggestion of a nondisplaced fracture through the ulnar styloid, however, this is not definitive.  The carpals themselves appear intact. The remainder of the hand also appears intact, without additional acute fractures or dislocations.  There is multifocal joint space narrowing, subchondral sclerosis, subchondral cyst formation and osteophyte formation, most prevalent in the DIP and PIP joints, as well as the first [facility] joint, compatible with advanced osteoarthritis.  IMPRESSION: 1. No acute radiographic abnormality of the left hand. 2.  Comminuted, impacted and mildly angulated fracture of the distal left radial metaphysis redemonstrated, as above. 3.  Findings are suspicious but not definitive for a nondisplaced fracture of  the ulnar styloid. 4.  Degenerative changes of osteoarthritis, as above.   Original Report Authenticated By: Trudie Reed, M.D.    Dg Hand Complete Right  02/02/2013  *RADIOLOGY REPORT*  Clinical Data: History of fall complaining of right-sided hand and wrist pain.  RIGHT HAND - COMPLETE 3+ VIEW  Comparison: No priors.  Findings: Three views of the right hand redemonstrate the presence of a highly comminuted oblique fracture through the distal radial metadiaphysis which appears impacted and dorsally  angulated approximately 35 degrees.  The hand itself appears intact, without additional definite fracture, subluxation or dislocation.  There is multifocal joint space narrowing, subchondral sclerosis, subchondral cyst formation and osteophyte formation, most pronounced in the DIP and PIP joints, as well as the first [facility] joint, compatible with advanced osteoarthritis.  IMPRESSION: 1.  No acute radiographic abnormality of the right hand. 2.  Comminuted, impacted, dorsally angulated oblique fracture through the distal right radial metadiaphysis redemonstrated. 3.  Advanced changes of osteoarthritis, as above.   Original Report Authenticated By: Trudie Reed, M.D.    Ct Maxillofacial Wo Cm  02/02/2013  *RADIOLOGY REPORT*  Clinical Data:  Status post fall; soft tissue hematoma over the left cheek, with laceration above the right eyebrow.  Concern for head injury.  CT HEAD WITHOUT CONTRAST CT MAXILLOFACIAL WITHOUT CONTRAST  Technique:  Multidetector CT imaging of the head and maxillofacial structures were performed using the standard protocol without intravenous contrast. Multiplanar CT image reconstructions of the maxillofacial structures were also generated.  Comparison:   None.  CT HEAD  Findings:   There is a subdural hematoma overlying the right cerebral hemisphere, measuring up to 8 mm in thickness.  A smaller subdural hematoma is noted along the right side of the anterior falx cerebri.  No midline shift is  seen.  Trace subarachnoid blood is noted tracking along the right parieto- occipital region.  No additional hemorrhage is identified.  Scattered subcortical white matter change likely reflects small vessel ischemic microangiopathy.  The posterior fossa, including the cerebellum, brainstem and fourth ventricle, is within normal limits.  The third and lateral ventricles, and basal ganglia are unremarkable in appearance.  There is no evidence of fracture; visualized osseous structures are unremarkable in appearance.  The visualized portions of the orbits are within normal limits.  The paranasal sinuses and mastoid air cells are well-aerated.  A soft tissue laceration is noted overlying the right frontal calvarium, with soft tissue swelling extending inferior to the right orbit.  IMPRESSION:  1.  8 mm subdural hematoma noted overlying the right cerebral hemisphere; smaller subdural hematoma noted along the right side of the anterior falx cerebri.  No midline shift seen. 2.  Trace subarachnoid blood noted tracking along the right parieto- occipital region. 3.  Scattered small vessel ischemic microangiopathy. 4.  Soft tissue laceration overlying the right frontal calvarium, with soft tissue swelling extending inferior to the right orbit.  CT MAXILLOFACIAL  Findings:   There is no evidence of fracture or dislocation.  The maxilla and mandible appear intact.  The nasal bone is unremarkable in appearance.  The visualized dentition demonstrates no acute abnormality.  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  The orbits are intact bilaterally.  The visualized paranasal sinuses and mastoid air cells are well-aerated.  Soft tissue swelling is noted overlying the right maxilla and about the right orbit, with a soft tissue laceration overlying the right frontal calvarium.  The parapharyngeal fat planes are preserved. The nasopharynx, oropharynx and hypopharynx are unremarkable in appearance.  The  visualized portions of the valleculae and piriform sinuses are grossly unremarkable.  Calcification is noted at the carotid bifurcations bilaterally.  The parotid and submandibular glands are within normal limits.  No cervical lymphadenopathy is seen.  IMPRESSION:  1.  No evidence of fracture or dislocation. 2.  Soft tissue swelling overlying the right maxilla and about the right orbit, with a soft tissue laceration overlying the right frontal calvarium. 3.  Calcification noted at  the carotid bifurcations bilaterally.  Critical Value/emergent results were called by telephone at the time of interpretation on 02/02/2013 at 06:32 a.m. to Dr. Jones Skene, who verbally acknowledged these results.   Original Report Authenticated By: Tonia Ghent, M.D.     Review of Systems  Unable to perform ROS: dementia    Blood pressure 105/87, pulse 95, temperature 97.8 F (36.6 C), temperature source Oral, resp. rate 16, SpO2 87.00%. Physical Exam  Constitutional: She appears well-developed and well-nourished. No distress.  HENT:  Head: Normocephalic.  Right Ear: External ear normal.  Left Ear: External ear normal.  Laceration 4 cm on right frontotemporal area.  ED MD suturing.    Eyes: Conjunctivae are normal. Pupils are equal, round, and reactive to light. No scleral icterus.  Neck: Normal range of motion. Neck supple. No JVD present. No tracheal deviation present. No thyromegaly present.  Cardiovascular: Normal rate, regular rhythm and intact distal pulses.  Exam reveals no gallop and no friction rub.   No murmur heard. Respiratory: Effort normal and breath sounds normal. No stridor. No respiratory distress. She has no wheezes. She has no rales. She exhibits no tenderness.  GI: Soft. Bowel sounds are normal. She exhibits no distension and no mass. There is no tenderness. There is no rebound and no guarding.  Musculoskeletal: Normal range of motion. She exhibits tenderness (B wrists). She exhibits no  edema.  Bilateral wrist deformities.  +motor/sensation grossly on hand/fingers.  Good cap refill on fingers  Lymphadenopathy:    She has no cervical adenopathy.  Neurological: She is alert. She exhibits normal muscle tone.  Eyes open.  Able to respond to simple questions in reasonable amount of time.  Responds to her name.   Skin: Skin is warm and dry. No rash noted. She is not diaphoretic. No erythema. No pallor.  Psychiatric:  Pt received Haldol, was combattive earlier.      Assessment/Plan Fall- Falls risk   TBI - ICU admit. Repeat head CT tomorrow.  Neurosurgery to evaluate.  Neuro checks.  Unclear how much of mental status is baseline vs head injury.  B wrist fractures - hand surgery to evaluate.  Will need to be sedated to set.  OK to sedate from my standpoint  Dementia - stay on home meds.   BYERLY,FAERA 02/02/2013, 8:45 AM

## 2013-02-03 ENCOUNTER — Inpatient Hospital Stay (HOSPITAL_COMMUNITY): Payer: Medicare Other | Admitting: Anesthesiology

## 2013-02-03 ENCOUNTER — Encounter (HOSPITAL_COMMUNITY): Payer: Self-pay | Admitting: Anesthesiology

## 2013-02-03 ENCOUNTER — Inpatient Hospital Stay (HOSPITAL_COMMUNITY): Payer: Medicare Other

## 2013-02-03 ENCOUNTER — Encounter (HOSPITAL_COMMUNITY): Admission: EM | Disposition: A | Discharge status: Skilled Nursing Facility | Payer: Self-pay | Source: Home / Self Care

## 2013-02-03 DIAGNOSIS — S62101A Fracture of unspecified carpal bone, right wrist, initial encounter for closed fracture: Secondary | ICD-10-CM

## 2013-02-03 HISTORY — PX: ORIF WRIST FRACTURE: SHX2133

## 2013-02-03 LAB — CBC
HCT: 41.1 % (ref 36.0–46.0)
Hemoglobin: 14 g/dL (ref 12.0–15.0)
RDW: 12.5 % (ref 11.5–15.5)
WBC: 8.5 10*3/uL (ref 4.0–10.5)

## 2013-02-03 LAB — BASIC METABOLIC PANEL
BUN: 8 mg/dL (ref 6–23)
Chloride: 102 mEq/L (ref 96–112)
GFR calc Af Amer: >90 mL/min (ref >90)
Potassium: 4.1 mEq/L (ref 3.5–5.1)

## 2013-02-03 SURGERY — OPEN REDUCTION INTERNAL FIXATION (ORIF) WRIST FRACTURE
Anesthesia: General | Site: Wrist | Laterality: Bilateral | Wound class: Clean

## 2013-02-03 MED ORDER — LACTATED RINGERS IV SOLN
INTRAVENOUS | Status: DC | PRN
  Administered 2013-02-03 (×2): via INTRAVENOUS

## 2013-02-03 MED ORDER — HYDROMORPHONE HCL PF 1 MG/ML IJ SOLN
0.2500 mg | INTRAMUSCULAR | Status: DC | PRN
  Administered 2013-02-03 (×3): 0.25 mg via INTRAVENOUS

## 2013-02-03 MED ORDER — HYDROMORPHONE HCL PF 1 MG/ML IJ SOLN
INTRAMUSCULAR | Status: AC
  Filled 2013-02-03: qty 1

## 2013-02-03 MED ORDER — ONDANSETRON HCL 4 MG/2ML IJ SOLN
INTRAMUSCULAR | Status: DC | PRN
  Administered 2013-02-03: 4 mg via INTRAVENOUS

## 2013-02-03 MED ORDER — BUPIVACAINE HCL 0.25 % IJ SOLN
INTRAMUSCULAR | Status: DC | PRN
  Administered 2013-02-03: 15 mL

## 2013-02-03 MED ORDER — EPHEDRINE SULFATE 50 MG/ML IJ SOLN
INTRAMUSCULAR | Status: DC | PRN
  Administered 2013-02-03 (×2): 10 mg via INTRAVENOUS

## 2013-02-03 MED ORDER — PROPOFOL 10 MG/ML IV BOLUS
INTRAVENOUS | Status: DC | PRN
  Administered 2013-02-03: 100 mg via INTRAVENOUS

## 2013-02-03 MED ORDER — BUPIVACAINE HCL (PF) 0.25 % IJ SOLN
INTRAMUSCULAR | Status: AC
  Filled 2013-02-03: qty 30

## 2013-02-03 MED ORDER — CEFAZOLIN SODIUM-DEXTROSE 2-3 GM-% IV SOLR
INTRAVENOUS | Status: AC
  Administered 2013-02-03: 2 g via INTRAVENOUS
  Filled 2013-02-03: qty 50

## 2013-02-03 MED ORDER — BUPIVACAINE HCL (PF) 0.25 % IJ SOLN
INTRAMUSCULAR | Status: AC
  Filled 2013-02-03: qty 10

## 2013-02-03 MED ORDER — PROMETHAZINE HCL 25 MG/ML IJ SOLN: 6.2500 mg | INTRAMUSCULAR | Status: DC | PRN

## 2013-02-03 MED ORDER — OXYCODONE HCL 5 MG PO TABS
ORAL_TABLET | ORAL | Status: AC
  Filled 2013-02-03: qty 1

## 2013-02-03 MED ORDER — OXYCODONE HCL 5 MG/5ML PO SOLN: 5.0000 mg | Freq: Once | ORAL | Status: AC | PRN

## 2013-02-03 MED ORDER — OXYCODONE HCL 5 MG PO TABS
5.0000 mg | ORAL_TABLET | Freq: Once | ORAL | Status: AC | PRN
Start: 2013-02-03 — End: 2013-02-03
  Administered 2013-02-03: 5 mg via ORAL

## 2013-02-03 MED ORDER — FENTANYL CITRATE 0.05 MG/ML IJ SOLN
INTRAMUSCULAR | Status: DC | PRN
  Administered 2013-02-03: 100 ug via INTRAVENOUS

## 2013-02-03 MED ORDER — LIDOCAINE HCL (CARDIAC) 20 MG/ML IV SOLN
INTRAVENOUS | Status: DC | PRN
  Administered 2013-02-03: 90 mg via INTRAVENOUS

## 2013-02-03 SURGICAL SUPPLY — 56 items
BANDAGE ELASTIC 3 VELCRO ST LF (GAUZE/BANDAGES/DRESSINGS) ×2 IMPLANT
BANDAGE ELASTIC 4 VELCRO ST LF (GAUZE/BANDAGES/DRESSINGS) ×1 IMPLANT
BIT DRILL 2.5X4 QC (BIT) ×1 IMPLANT
BNDG CMPR 9X4 STRL LF SNTH (GAUZE/BANDAGES/DRESSINGS)
BNDG ESMARK 4X9 LF (GAUZE/BANDAGES/DRESSINGS) IMPLANT
CANISTER SUCTION 1500CC (MISCELLANEOUS) ×2 IMPLANT
CHLORAPREP W/TINT 26ML (MISCELLANEOUS) ×2 IMPLANT
CLOTH BEACON ORANGE TIMEOUT ST (SAFETY) ×2 IMPLANT
CORDS BIPOLAR (ELECTRODE) ×2 IMPLANT
COVER SURGICAL LIGHT HANDLE (MISCELLANEOUS) ×2 IMPLANT
CUFF TOURNIQUET SINGLE 18IN (TOURNIQUET CUFF) ×2 IMPLANT
CUFF TOURNIQUET SINGLE 24IN (TOURNIQUET CUFF) IMPLANT
DRAIN TLS ROUND 10FR (DRAIN) IMPLANT
DRAPE OEC MINIVIEW 54X84 (DRAPES) ×2 IMPLANT
DRAPE SURG 17X23 STRL (DRAPES) ×2 IMPLANT
GAUZE XEROFORM 1X8 LF (GAUZE/BANDAGES/DRESSINGS) ×2 IMPLANT
GLOVE BIOGEL M STRL SZ7.5 (GLOVE) ×2 IMPLANT
GOWN PREVENTION PLUS XLARGE (GOWN DISPOSABLE) ×2 IMPLANT
GOWN STRL REIN XL XLG (GOWN DISPOSABLE) ×2 IMPLANT
K-WIRE 1.6 (WIRE) ×4
K-WIRE FX5X1.6XNS BN SS (WIRE) ×2
KIT BASIN OR (CUSTOM PROCEDURE TRAY) ×2 IMPLANT
KWIRE FX5X1.6XNS BN SS (WIRE) IMPLANT
NEEDLE HYPO 22GX1.5 SAFETY (NEEDLE) ×1 IMPLANT
NS IRRIG 1000ML POUR BTL (IV SOLUTION) ×2 IMPLANT
PACK ORTHO EXTREMITY (CUSTOM PROCEDURE TRAY) ×2 IMPLANT
PAD CAST 3X4 CTTN HI CHSV (CAST SUPPLIES) IMPLANT
PAD CAST 4YDX4 CTTN HI CHSV (CAST SUPPLIES) IMPLANT
PADDING CAST ABS 3INX4YD NS (CAST SUPPLIES) ×1
PADDING CAST ABS 4INX4YD NS (CAST SUPPLIES)
PADDING CAST ABS COTTON 3X4 (CAST SUPPLIES) IMPLANT
PADDING CAST ABS COTTON 4X4 ST (CAST SUPPLIES) ×1 IMPLANT
PADDING CAST COTTON 3X4 STRL (CAST SUPPLIES)
PADDING CAST COTTON 4X4 STRL (CAST SUPPLIES) ×2
PEG THREADED 2.5MMX16MM LONG (Peg) ×1 IMPLANT
PEG THREADED 2.5MMX18MM LONG (Peg) ×5 IMPLANT
PLATE SHORT 24.4X51.3 RT (Plate) ×1 IMPLANT
SCREW BN 12X3.5XNS CORT TI (Screw) IMPLANT
SCREW CORT 3.5X12 (Screw) ×4 IMPLANT
SCREW CORT 3.5X14 LNG (Screw) ×1 IMPLANT
SCREW CORT 3.5X16 LNG (Screw) ×1 IMPLANT
SPLINT FIBERGLASS 3X35 (CAST SUPPLIES) ×2 IMPLANT
SPLINT PLASTER CAST XFAST 4X15 (CAST SUPPLIES) IMPLANT
SPLINT PLASTER XTRA FAST SET 4 (CAST SUPPLIES)
SPONGE GAUZE 4X4 12PLY (GAUZE/BANDAGES/DRESSINGS) ×1 IMPLANT
SUT ETHILON 3 0 PS 1 (SUTURE) ×2 IMPLANT
SUT ETHILON 4 0 PS 2 18 (SUTURE) ×2 IMPLANT
SUT VIC AB 3-0 PS1 18 (SUTURE) ×2
SUT VIC AB 3-0 PS1 18XBRD (SUTURE) ×1 IMPLANT
SUT VICRYL 4-0 PS2 18IN ABS (SUTURE) ×2 IMPLANT
SYR BULB 3OZ (MISCELLANEOUS) ×2 IMPLANT
SYR CONTROL 10ML LL (SYRINGE) ×1 IMPLANT
TOWEL OR 17X24 6PK STRL BLUE (TOWEL DISPOSABLE) ×2 IMPLANT
TUBE CONNECTING 20X1/4 (TUBING) ×2 IMPLANT
UNDERPAD 30X30 INCONTINENT (UNDERPADS AND DIAPERS) ×2 IMPLANT
WATER STERILE IRR 1000ML POUR (IV SOLUTION) ×2 IMPLANT

## 2013-02-03 NOTE — Progress Notes (Signed)
Subjective: Patient reports knows her name, not age or where she is  Objective: Vital signs in last 24 hours: Temp:  [97.8 F (36.6 C)-99 F (37.2 C)] 98.1 F (36.7 C) (03/09 0422) Pulse Rate:  [78-99] 80 (03/09 0500) Resp:  [13-22] 13 (03/09 0500) BP: (105-159)/(56-94) 121/56 mmHg (03/09 0500) SpO2:  [87 %-100 %] 88 % (03/09 0500) Weight:  [73.3 kg (161 lb 9.6 oz)] 73.3 kg (161 lb 9.6 oz) (03/08 1534)  Intake/Output from previous day: 03/08 0701 - 03/09 0700 In: 865 [P.O.:40; I.V.:825] Out: 1000 [Urine:1000] Intake/Output this shift: Total I/O In: 675 [I.V.:675] Out: 1000 [Urine:1000]  Physical Exam: Sitting up in bed, oriented x 1.  Does not appear too uncomfortable.  Both wrists splinted.  Bilateral periorbital ecchymosis more confluent.  Lab Results:  Recent Labs  02/02/13 0529 02/03/13 0452  WBC 18.8* 8.5  HGB 15.3* 14.0  HCT 44.3 41.1  PLT 282 243   BMET  Recent Labs  02/02/13 0529  NA 140  K 4.1  CL 101  CO2 29  GLUCOSE 145*  BUN 10  CREATININE 0.71  CALCIUM 9.6    Studies/Results: Dg Wrist Complete Left  02/02/2013  *RADIOLOGY REPORT*  Clinical Data: History of trauma from fall complaining of left sided wrist pain.  LEFT WRIST - COMPLETE 3+ VIEW  Comparison: No priors.  Findings: Four views of the left wrist demonstrate a comminuted mildly impacted and mildly dorsally angulated (approximately 30 degrees) fracture through the distal radial metaphysis.  Some of the views provided suggest the possibility of a nondisplaced fracture through the ulnar styloid, however, this is not definitive.  Carpals themselves appear intact.  Overlying soft tissues are markedly swollen.  IMPRESSION: 1.  Comminuted impacted mildly dorsally angulated fracture through the distal left radial metaphysis, as above. 2.  Possible nondisplaced fracture through the ulnar styloid.   Original Report Authenticated By: Trudie Reed, M.D.    Dg Wrist Complete Right  02/02/2013   *RADIOLOGY REPORT*  Clinical Data: History of trauma from a fall complaining of right sided wrist pain.  RIGHT WRIST - COMPLETE 3+ VIEW  Comparison: No priors.  Findings: Three views of the right wrist demonstrates a comminuted oblique fracture of the distal radial metadiaphysis which appears mildly impacted and dorsally angulated approximately 35 degrees. There is proximal migration of the carpals relative to the distal ulna.  The distal ulna appears intact.  The carpals themselves appear intact.  Overlying soft tissues are markedly swollen.  IMPRESSION: 1.  Severely comminuted, impacted and angulated leak fracture through the distal radial metadiaphysis, as above.   Original Report Authenticated By: Trudie Reed, M.D.    Ct Head Wo Contrast  02/02/2013  *RADIOLOGY REPORT*  Clinical Data:  Status post fall; soft tissue hematoma over the left cheek, with laceration above the right eyebrow.  Concern for head injury.  CT HEAD WITHOUT CONTRAST CT MAXILLOFACIAL WITHOUT CONTRAST  Technique:  Multidetector CT imaging of the head and maxillofacial structures were performed using the standard protocol without intravenous contrast. Multiplanar CT image reconstructions of the maxillofacial structures were also generated.  Comparison:   None.  CT HEAD  Findings:   There is a subdural hematoma overlying the right cerebral hemisphere, measuring up to 8 mm in thickness.  A smaller subdural hematoma is noted along the right side of the anterior falx cerebri.  No midline shift is seen.  Trace subarachnoid blood is noted tracking along the right parieto- occipital region.  No additional hemorrhage is identified.  Scattered subcortical white matter change likely reflects small vessel ischemic microangiopathy.  The posterior fossa, including the cerebellum, brainstem and fourth ventricle, is within normal limits.  The third and lateral ventricles, and basal ganglia are unremarkable in appearance.  There is no evidence of  fracture; visualized osseous structures are unremarkable in appearance.  The visualized portions of the orbits are within normal limits.  The paranasal sinuses and mastoid air cells are well-aerated.  A soft tissue laceration is noted overlying the right frontal calvarium, with soft tissue swelling extending inferior to the right orbit.  IMPRESSION:  1.  8 mm subdural hematoma noted overlying the right cerebral hemisphere; smaller subdural hematoma noted along the right side of the anterior falx cerebri.  No midline shift seen. 2.  Trace subarachnoid blood noted tracking along the right parieto- occipital region. 3.  Scattered small vessel ischemic microangiopathy. 4.  Soft tissue laceration overlying the right frontal calvarium, with soft tissue swelling extending inferior to the right orbit.  CT MAXILLOFACIAL  Findings:   There is no evidence of fracture or dislocation.  The maxilla and mandible appear intact.  The nasal bone is unremarkable in appearance.  The visualized dentition demonstrates no acute abnormality.  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  The orbits are intact bilaterally.  The visualized paranasal sinuses and mastoid air cells are well-aerated.  Soft tissue swelling is noted overlying the right maxilla and about the right orbit, with a soft tissue laceration overlying the right frontal calvarium.  The parapharyngeal fat planes are preserved. The nasopharynx, oropharynx and hypopharynx are unremarkable in appearance.  The visualized portions of the valleculae and piriform sinuses are grossly unremarkable.  Calcification is noted at the carotid bifurcations bilaterally.  The parotid and submandibular glands are within normal limits.  No cervical lymphadenopathy is seen.  IMPRESSION:  1.  No evidence of fracture or dislocation. 2.  Soft tissue swelling overlying the right maxilla and about the right orbit, with a soft tissue laceration overlying the right frontal  calvarium. 3.  Calcification noted at the carotid bifurcations bilaterally.  Critical Value/emergent results were called by telephone at the time of interpretation on 02/02/2013 at 06:32 a.m. to Dr. Jones Skene, who verbally acknowledged these results.   Original Report Authenticated By: Tonia Ghent, M.D.    Dg Chest Port 1 View  02/02/2013  *RADIOLOGY REPORT*  Clinical Data: Post fall, trauma, confusion  PORTABLE CHEST - 1 VIEW  Comparison: None.  Findings:  Borderline enlarged cardiac silhouette and mediastinal contours, possibly accentuated due to decreased lung volumes and AP projection.  Minimal bibasilar opacities, favored to represent atelectasis.  No definite pleural effusion or pneumothorax.  No definite evidence of pulmonary edema. A presumed granuloma overlies the medial aspect the right upper lung.  No acute osseous abnormalities.  IMPRESSION: Bibasilar atelectasis suspected on this hypoventilated AP examination without definite acute cardiopulmonary disease. Further evaluation with a PA and lateral chest radiograph may be obtained as clinically indicated.   Original Report Authenticated By: Tacey Ruiz, MD    Dg Hand Complete Left  02/02/2013  *RADIOLOGY REPORT*  Clinical Data: History of trauma from fall complaining of left sided wrist pain.  LEFT HAND - COMPLETE 3+ VIEW  Comparison: No priors.  Findings: Three views of the left wrist demonstrate a comminuted impacted fracture of the distal left radial metaphysis which appears mildly dorsally angulated (approximately 30 degrees). Overlying soft tissues are markedly swollen.  There is a suggestion of a  nondisplaced fracture through the ulnar styloid, however, this is not definitive.  The carpals themselves appear intact. The remainder of the hand also appears intact, without additional acute fractures or dislocations.  There is multifocal joint space narrowing, subchondral sclerosis, subchondral cyst formation and osteophyte formation, most  prevalent in the DIP and PIP joints, as well as the first [facility] joint, compatible with advanced osteoarthritis.  IMPRESSION: 1. No acute radiographic abnormality of the left hand. 2.  Comminuted, impacted and mildly angulated fracture of the distal left radial metaphysis redemonstrated, as above. 3.  Findings are suspicious but not definitive for a nondisplaced fracture of the ulnar styloid. 4.  Degenerative changes of osteoarthritis, as above.   Original Report Authenticated By: Trudie Reed, M.D.    Dg Hand Complete Right  02/02/2013  *RADIOLOGY REPORT*  Clinical Data: History of fall complaining of right-sided hand and wrist pain.  RIGHT HAND - COMPLETE 3+ VIEW  Comparison: No priors.  Findings: Three views of the right hand redemonstrate the presence of a highly comminuted oblique fracture through the distal radial metadiaphysis which appears impacted and dorsally angulated approximately 35 degrees.  The hand itself appears intact, without additional definite fracture, subluxation or dislocation.  There is multifocal joint space narrowing, subchondral sclerosis, subchondral cyst formation and osteophyte formation, most pronounced in the DIP and PIP joints, as well as the first [facility] joint, compatible with advanced osteoarthritis.  IMPRESSION: 1.  No acute radiographic abnormality of the right hand. 2.  Comminuted, impacted, dorsally angulated oblique fracture through the distal right radial metadiaphysis redemonstrated. 3.  Advanced changes of osteoarthritis, as above.   Original Report Authenticated By: Trudie Reed, M.D.    Ct Maxillofacial Wo Cm  02/02/2013  *RADIOLOGY REPORT*  Clinical Data:  Status post fall; soft tissue hematoma over the left cheek, with laceration above the right eyebrow.  Concern for head injury.  CT HEAD WITHOUT CONTRAST CT MAXILLOFACIAL WITHOUT CONTRAST  Technique:  Multidetector CT imaging of the head and maxillofacial structures were performed using the standard protocol  without intravenous contrast. Multiplanar CT image reconstructions of the maxillofacial structures were also generated.  Comparison:   None.  CT HEAD  Findings:   There is a subdural hematoma overlying the right cerebral hemisphere, measuring up to 8 mm in thickness.  A smaller subdural hematoma is noted along the right side of the anterior falx cerebri.  No midline shift is seen.  Trace subarachnoid blood is noted tracking along the right parieto- occipital region.  No additional hemorrhage is identified.  Scattered subcortical white matter change likely reflects small vessel ischemic microangiopathy.  The posterior fossa, including the cerebellum, brainstem and fourth ventricle, is within normal limits.  The third and lateral ventricles, and basal ganglia are unremarkable in appearance.  There is no evidence of fracture; visualized osseous structures are unremarkable in appearance.  The visualized portions of the orbits are within normal limits.  The paranasal sinuses and mastoid air cells are well-aerated.  A soft tissue laceration is noted overlying the right frontal calvarium, with soft tissue swelling extending inferior to the right orbit.  IMPRESSION:  1.  8 mm subdural hematoma noted overlying the right cerebral hemisphere; smaller subdural hematoma noted along the right side of the anterior falx cerebri.  No midline shift seen. 2.  Trace subarachnoid blood noted tracking along the right parieto- occipital region. 3.  Scattered small vessel ischemic microangiopathy. 4.  Soft tissue laceration overlying the right frontal calvarium, with soft tissue swelling extending inferior  to the right orbit.  CT MAXILLOFACIAL  Findings:   There is no evidence of fracture or dislocation.  The maxilla and mandible appear intact.  The nasal bone is unremarkable in appearance.  The visualized dentition demonstrates no acute abnormality.  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  The orbits are  intact bilaterally.  The visualized paranasal sinuses and mastoid air cells are well-aerated.  Soft tissue swelling is noted overlying the right maxilla and about the right orbit, with a soft tissue laceration overlying the right frontal calvarium.  The parapharyngeal fat planes are preserved. The nasopharynx, oropharynx and hypopharynx are unremarkable in appearance.  The visualized portions of the valleculae and piriform sinuses are grossly unremarkable.  Calcification is noted at the carotid bifurcations bilaterally.  The parotid and submandibular glands are within normal limits.  No cervical lymphadenopathy is seen.  IMPRESSION:  1.  No evidence of fracture or dislocation. 2.  Soft tissue swelling overlying the right maxilla and about the right orbit, with a soft tissue laceration overlying the right frontal calvarium. 3.  Calcification noted at the carotid bifurcations bilaterally.  Critical Value/emergent results were called by telephone at the time of interpretation on 02/02/2013 at 06:32 a.m. to Dr. Jones Skene, who verbally acknowledged these results.   Original Report Authenticated By: Tonia Ghent, M.D.     Assessment/Plan: Stable exam and CT.  OK to transfer to floor per trauma service.  Mobilize with assist as tolerated.    LOS: 1 day    Dorian Heckle, MD 02/03/2013, 5:23 AM

## 2013-02-03 NOTE — Anesthesia Procedure Notes (Signed)
Procedure Name: LMA Insertion Date/Time: 02/03/2013 1:51 PM Performed by: Alanda Amass A Pre-anesthesia Checklist: Patient identified, Timeout performed, Emergency Drugs available, Suction available and Patient being monitored Patient Re-evaluated:Patient Re-evaluated prior to inductionOxygen Delivery Method: Circle system utilized Preoxygenation: Pre-oxygenation with 100% oxygen Intubation Type: IV induction LMA: LMA inserted LMA Size: 4.0 Number of attempts: 1 Tube secured with: Tape Dental Injury: Teeth and Oropharynx as per pre-operative assessment

## 2013-02-03 NOTE — Anesthesia Preprocedure Evaluation (Addendum)
Anesthesia Evaluation Anesthesia Physical Anesthesia Plan Anesthesia Quick Evaluation  

## 2013-02-03 NOTE — Transfer of Care (Signed)
Immediate Anesthesia Transfer of Care Note  Patient: Crystal Davila  Procedure(s) Performed: Procedure(s): OPEN REDUCTION INTERNAL FIXATION (ORIF) RIGHT WRIST FRACTURE,  CLOSED REDUCTION LEFT WRIST (Bilateral)  Patient Location: PACU  Anesthesia Type:General  Level of Consciousness: sedated  Airway & Oxygen Therapy: Patient Spontanous Breathing and Patient connected to nasal cannula oxygen  Post-op Assessment: Report given to PACU RN and Post -op Vital signs reviewed and stable  Post vital signs: Reviewed and stable  Complications: No apparent anesthesia complications

## 2013-02-03 NOTE — Progress Notes (Signed)
Subjective: Confused, probably base line. Trying to get up and leave  Denies SOB, pain  Objective: Vital signs in last 24 hours: Temp:  [97.8 F (36.6 C)-99 F (37.2 C)] 98.5 F (36.9 C) (03/09 0743) Pulse Rate:  [78-99] 80 (03/09 0800) Resp:  [13-22] 18 (03/09 0800) BP: (117-146)/(54-116) 122/54 mmHg (03/09 0800) SpO2:  [88 %-100 %] 95 % (03/09 0800) Weight:  [161 lb 9.6 oz (73.3 kg)] 161 lb 9.6 oz (73.3 kg) (03/08 1534)    Intake/Output from previous day: 03/08 0701 - 03/09 0700 In: 1015 [P.O.:40; I.V.:975] Out: 1000 [Urine:1000] Intake/Output this shift: Total I/O In: -  Out: 350 [Urine:350]  Awake and alert, but confused Lungs clear CV RRR Wrists in splints bilat  Lab Results:   Recent Labs  02/02/13 0529 02/03/13 0452  WBC 18.8* 8.5  HGB 15.3* 14.0  HCT 44.3 41.1  PLT 282 243   BMET  Recent Labs  02/02/13 0529 02/03/13 0452  NA 140 138  K 4.1 4.1  CL 101 102  CO2 29 28  GLUCOSE 145* 147*  BUN 10 8  CREATININE 0.71 0.63  CALCIUM 9.6 8.9   PT/INR No results found for this basename: LABPROT, INR,  in the last 72 hours ABG No results found for this basename: PHART, PCO2, PO2, HCO3,  in the last 72 hours  Studies/Results: Dg Wrist Complete Left  02/02/2013  *RADIOLOGY REPORT*  Clinical Data: History of trauma from fall complaining of left sided wrist pain.  LEFT WRIST - COMPLETE 3+ VIEW  Comparison: No priors.  Findings: Four views of the left wrist demonstrate a comminuted mildly impacted and mildly dorsally angulated (approximately 30 degrees) fracture through the distal radial metaphysis.  Some of the views provided suggest the possibility of a nondisplaced fracture through the ulnar styloid, however, this is not definitive.  Carpals themselves appear intact.  Overlying soft tissues are markedly swollen.  IMPRESSION: 1.  Comminuted impacted mildly dorsally angulated fracture through the distal left radial metaphysis, as above. 2.  Possible  nondisplaced fracture through the ulnar styloid.   Original Report Authenticated By: Trudie Reed, M.D.    Dg Wrist Complete Right  02/02/2013  *RADIOLOGY REPORT*  Clinical Data: History of trauma from a fall complaining of right sided wrist pain.  RIGHT WRIST - COMPLETE 3+ VIEW  Comparison: No priors.  Findings: Three views of the right wrist demonstrates a comminuted oblique fracture of the distal radial metadiaphysis which appears mildly impacted and dorsally angulated approximately 35 degrees. There is proximal migration of the carpals relative to the distal ulna.  The distal ulna appears intact.  The carpals themselves appear intact.  Overlying soft tissues are markedly swollen.  IMPRESSION: 1.  Severely comminuted, impacted and angulated leak fracture through the distal radial metadiaphysis, as above.   Original Report Authenticated By: Trudie Reed, M.D.    Ct Head Without Contrast  02/03/2013  *RADIOLOGY REPORT*  Clinical Data: Follow-up traumatic brain injury  CT HEAD WITHOUT CONTRAST  Technique:  Contiguous axial images were obtained from the base of the skull through the vertex without contrast.  Comparison: CT 02/02/2013  Findings: Right frontal temporal subdural hematoma measures 5 mm and is unchanged from yesterday.  No new area of hemorrhage.  No midline shift.  There is mild atrophy.  No acute infarct or mass.  Negative for skull fracture.  Soft tissue swelling overlying the right face.  IMPRESSION: 5 mm right-sided subdural hematoma is unchanged.  No new findings since yesterday.  Original Report Authenticated By: Janeece Riggers, M.D.    Ct Head Wo Contrast  02/02/2013  *RADIOLOGY REPORT*  Clinical Data:  Status post fall; soft tissue hematoma over the left cheek, with laceration above the right eyebrow.  Concern for head injury.  CT HEAD WITHOUT CONTRAST CT MAXILLOFACIAL WITHOUT CONTRAST  Technique:  Multidetector CT imaging of the head and maxillofacial structures were performed using  the standard protocol without intravenous contrast. Multiplanar CT image reconstructions of the maxillofacial structures were also generated.  Comparison:   None.  CT HEAD  Findings:   There is a subdural hematoma overlying the right cerebral hemisphere, measuring up to 8 mm in thickness.  A smaller subdural hematoma is noted along the right side of the anterior falx cerebri.  No midline shift is seen.  Trace subarachnoid blood is noted tracking along the right parieto- occipital region.  No additional hemorrhage is identified.  Scattered subcortical white matter change likely reflects small vessel ischemic microangiopathy.  The posterior fossa, including the cerebellum, brainstem and fourth ventricle, is within normal limits.  The third and lateral ventricles, and basal ganglia are unremarkable in appearance.  There is no evidence of fracture; visualized osseous structures are unremarkable in appearance.  The visualized portions of the orbits are within normal limits.  The paranasal sinuses and mastoid air cells are well-aerated.  A soft tissue laceration is noted overlying the right frontal calvarium, with soft tissue swelling extending inferior to the right orbit.  IMPRESSION:  1.  8 mm subdural hematoma noted overlying the right cerebral hemisphere; smaller subdural hematoma noted along the right side of the anterior falx cerebri.  No midline shift seen. 2.  Trace subarachnoid blood noted tracking along the right parieto- occipital region. 3.  Scattered small vessel ischemic microangiopathy. 4.  Soft tissue laceration overlying the right frontal calvarium, with soft tissue swelling extending inferior to the right orbit.  CT MAXILLOFACIAL  Findings:   There is no evidence of fracture or dislocation.  The maxilla and mandible appear intact.  The nasal bone is unremarkable in appearance.  The visualized dentition demonstrates no acute abnormality.  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  The orbits are intact bilaterally.  The visualized paranasal sinuses and mastoid air cells are well-aerated.  Soft tissue swelling is noted overlying the right maxilla and about the right orbit, with a soft tissue laceration overlying the right frontal calvarium.  The parapharyngeal fat planes are preserved. The nasopharynx, oropharynx and hypopharynx are unremarkable in appearance.  The visualized portions of the valleculae and piriform sinuses are grossly unremarkable.  Calcification is noted at the carotid bifurcations bilaterally.  The parotid and submandibular glands are within normal limits.  No cervical lymphadenopathy is seen.  IMPRESSION:  1.  No evidence of fracture or dislocation. 2.  Soft tissue swelling overlying the right maxilla and about the right orbit, with a soft tissue laceration overlying the right frontal calvarium. 3.  Calcification noted at the carotid bifurcations bilaterally.  Critical Value/emergent results were called by telephone at the time of interpretation on 02/02/2013 at 06:32 a.m. to Dr. Jones Skene, who verbally acknowledged these results.   Original Report Authenticated By: Tonia Ghent, M.D.    Dg Chest Port 1 View  02/02/2013  *RADIOLOGY REPORT*  Clinical Data: Post fall, trauma, confusion  PORTABLE CHEST - 1 VIEW  Comparison: None.  Findings:  Borderline enlarged cardiac silhouette and mediastinal contours, possibly accentuated due to decreased lung volumes and AP  projection.  Minimal bibasilar opacities, favored to represent atelectasis.  No definite pleural effusion or pneumothorax.  No definite evidence of pulmonary edema. A presumed granuloma overlies the medial aspect the right upper lung.  No acute osseous abnormalities.  IMPRESSION: Bibasilar atelectasis suspected on this hypoventilated AP examination without definite acute cardiopulmonary disease. Further evaluation with a PA and lateral chest radiograph may be obtained as clinically indicated.   Original  Report Authenticated By: Tacey Ruiz, MD    Dg Hand Complete Left  02/02/2013  *RADIOLOGY REPORT*  Clinical Data: History of trauma from fall complaining of left sided wrist pain.  LEFT HAND - COMPLETE 3+ VIEW  Comparison: No priors.  Findings: Three views of the left wrist demonstrate a comminuted impacted fracture of the distal left radial metaphysis which appears mildly dorsally angulated (approximately 30 degrees). Overlying soft tissues are markedly swollen.  There is a suggestion of a nondisplaced fracture through the ulnar styloid, however, this is not definitive.  The carpals themselves appear intact. The remainder of the hand also appears intact, without additional acute fractures or dislocations.  There is multifocal joint space narrowing, subchondral sclerosis, subchondral cyst formation and osteophyte formation, most prevalent in the DIP and PIP joints, as well as the first [facility] joint, compatible with advanced osteoarthritis.  IMPRESSION: 1. No acute radiographic abnormality of the left hand. 2.  Comminuted, impacted and mildly angulated fracture of the distal left radial metaphysis redemonstrated, as above. 3.  Findings are suspicious but not definitive for a nondisplaced fracture of the ulnar styloid. 4.  Degenerative changes of osteoarthritis, as above.   Original Report Authenticated By: Trudie Reed, M.D.    Dg Hand Complete Right  02/02/2013  *RADIOLOGY REPORT*  Clinical Data: History of fall complaining of right-sided hand and wrist pain.  RIGHT HAND - COMPLETE 3+ VIEW  Comparison: No priors.  Findings: Three views of the right hand redemonstrate the presence of a highly comminuted oblique fracture through the distal radial metadiaphysis which appears impacted and dorsally angulated approximately 35 degrees.  The hand itself appears intact, without additional definite fracture, subluxation or dislocation.  There is multifocal joint space narrowing, subchondral sclerosis, subchondral cyst  formation and osteophyte formation, most pronounced in the DIP and PIP joints, as well as the first [facility] joint, compatible with advanced osteoarthritis.  IMPRESSION: 1.  No acute radiographic abnormality of the right hand. 2.  Comminuted, impacted, dorsally angulated oblique fracture through the distal right radial metadiaphysis redemonstrated. 3.  Advanced changes of osteoarthritis, as above.   Original Report Authenticated By: Trudie Reed, M.D.    Ct Maxillofacial Wo Cm  02/02/2013  *RADIOLOGY REPORT*  Clinical Data:  Status post fall; soft tissue hematoma over the left cheek, with laceration above the right eyebrow.  Concern for head injury.  CT HEAD WITHOUT CONTRAST CT MAXILLOFACIAL WITHOUT CONTRAST  Technique:  Multidetector CT imaging of the head and maxillofacial structures were performed using the standard protocol without intravenous contrast. Multiplanar CT image reconstructions of the maxillofacial structures were also generated.  Comparison:   None.  CT HEAD  Findings:   There is a subdural hematoma overlying the right cerebral hemisphere, measuring up to 8 mm in thickness.  A smaller subdural hematoma is noted along the right side of the anterior falx cerebri.  No midline shift is seen.  Trace subarachnoid blood is noted tracking along the right parieto- occipital region.  No additional hemorrhage is identified.  Scattered subcortical white matter change likely reflects small vessel  ischemic microangiopathy.  The posterior fossa, including the cerebellum, brainstem and fourth ventricle, is within normal limits.  The third and lateral ventricles, and basal ganglia are unremarkable in appearance.  There is no evidence of fracture; visualized osseous structures are unremarkable in appearance.  The visualized portions of the orbits are within normal limits.  The paranasal sinuses and mastoid air cells are well-aerated.  A soft tissue laceration is noted overlying the right frontal calvarium, with soft  tissue swelling extending inferior to the right orbit.  IMPRESSION:  1.  8 mm subdural hematoma noted overlying the right cerebral hemisphere; smaller subdural hematoma noted along the right side of the anterior falx cerebri.  No midline shift seen. 2.  Trace subarachnoid blood noted tracking along the right parieto- occipital region. 3.  Scattered small vessel ischemic microangiopathy. 4.  Soft tissue laceration overlying the right frontal calvarium, with soft tissue swelling extending inferior to the right orbit.  CT MAXILLOFACIAL  Findings:   There is no evidence of fracture or dislocation.  The maxilla and mandible appear intact.  The nasal bone is unremarkable in appearance.  The visualized dentition demonstrates no acute abnormality.  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  The orbits are intact bilaterally.  The visualized paranasal sinuses and mastoid air cells are well-aerated.  Soft tissue swelling is noted overlying the right maxilla and about the right orbit, with a soft tissue laceration overlying the right frontal calvarium.  The parapharyngeal fat planes are preserved. The nasopharynx, oropharynx and hypopharynx are unremarkable in appearance.  The visualized portions of the valleculae and piriform sinuses are grossly unremarkable.  Calcification is noted at the carotid bifurcations bilaterally.  The parotid and submandibular glands are within normal limits.  No cervical lymphadenopathy is seen.  IMPRESSION:  1.  No evidence of fracture or dislocation. 2.  Soft tissue swelling overlying the right maxilla and about the right orbit, with a soft tissue laceration overlying the right frontal calvarium. 3.  Calcification noted at the carotid bifurcations bilaterally.  Critical Value/emergent results were called by telephone at the time of interpretation on 02/02/2013 at 06:32 a.m. to Dr. Jones Skene, who verbally acknowledged these results.   Original Report Authenticated  By: Tonia Ghent, M.D.     Anti-infectives: Anti-infectives   None      Assessment/Plan: s/p * No surgery found *  Transfer to floor Needs sitter OK from trauma surg and neurosurg to go to OR for wrist fx.  LOS: 1 day    BLACKMAN,DOUGLAS A 02/03/2013

## 2013-02-03 NOTE — Anesthesia Postprocedure Evaluation (Signed)
Anesthesia Post Note  Patient: Crystal Davila  Procedure(s) Performed: Procedure(s) (LRB): OPEN REDUCTION INTERNAL FIXATION (ORIF) RIGHT WRIST FRACTURE,  CLOSED REDUCTION LEFT WRIST (Bilateral)  Anesthesia type: general  Patient location: PACU  Post pain: Pain level controlled  Post assessment: Patient's Cardiovascular Status Stable  Last Vitals:  Filed Vitals:   02/03/13 1630  BP: 159/77  Pulse: 109  Temp:   Resp: 14    Post vital signs: Reviewed and stable  Level of consciousness: sedated  Complications: No apparent anesthesia complications

## 2013-02-04 ENCOUNTER — Inpatient Hospital Stay (HOSPITAL_COMMUNITY): Payer: Medicare Other

## 2013-02-04 ENCOUNTER — Encounter (HOSPITAL_COMMUNITY): Payer: Self-pay | Admitting: General Surgery

## 2013-02-04 DIAGNOSIS — W19XXXA Unspecified fall, initial encounter: Secondary | ICD-10-CM

## 2013-02-04 DIAGNOSIS — F0390 Unspecified dementia without behavioral disturbance: Secondary | ICD-10-CM | POA: Insufficient documentation

## 2013-02-04 DIAGNOSIS — S066XAA Traumatic subarachnoid hemorrhage with loss of consciousness status unknown, initial encounter: Secondary | ICD-10-CM

## 2013-02-04 MED ORDER — TRAMADOL HCL 50 MG PO TABS
50.0000 mg | ORAL_TABLET | Freq: Four times a day (QID) | ORAL | Status: DC | PRN
  Administered 2013-02-04: 50 mg via ORAL
  Administered 2013-02-04 – 2013-02-05 (×2): 100 mg via ORAL
  Filled 2013-02-04: qty 1
  Filled 2013-02-04 (×2): qty 2

## 2013-02-04 MED ORDER — MORPHINE SULFATE 2 MG/ML IJ SOLN: 1.0000 mg | INTRAMUSCULAR | Status: DC | PRN

## 2013-02-04 NOTE — Clinical Social Work Placement (Addendum)
    Clinical Social Work Department CLINICAL SOCIAL WORK PLACEMENT NOTE 02/04/2013  Patient:  Crystal Davila, Crystal Davila  Account Number:  [id number] Admit date:  02/02/2013  Clinical Social Worker:  Macario Golds, LCSW  Date/time:  02/04/2013 11:00 AM  Clinical Social Work is seeking post-discharge placement for this patient at the following level of care:   SKILLED NURSING   (*CSW will update this form in Epic as items are completed)   02/04/2013  Patient/family provided with [facility] Department of Clinical Social Work's list of facilities offering this level of care within the geographic area requested by the patient (or if unable, by the patient's family).  02/04/2013  Patient/family informed of their freedom to choose among providers that offer the needed level of care, that participate in Medicare, Medicaid or managed care program needed by the patient, have an available bed and are willing to accept the patient.  02/04/2013  Patient/family informed of [facility]' ownership interest in [facility], as well as of the fact that they are under no obligation to receive care at this facility.  PASARR submitted to EDS on 02/04/2013 PASARR number received from [employer] on 02/04/2013  FL2 transmitted to all facilities in geographic area requested by pt/family on  02/04/2013 FL2 transmitted to all facilities within larger geographic area on   Patient informed that his/her managed care company has contracts with or will negotiate with  certain facilities, including the following:     Patient/family informed of bed offers received:  02/05/13 Patient chooses bed at  [facility] Physician recommends and patient chooses bed at    Patient to be transferred to [facility] on 02/05/13   Patient to be transferred to facility by  [facility]  The following physician request were entered in Epic:   Additional Comments: 03/10  Patient likely will need facility with  locked unit or wander guard

## 2013-02-04 NOTE — Evaluation (Signed)
Physical Therapy Evaluation Patient Details Name: Crystal Davila MRN: [id number] DOB: [birth year] Today's Date: 02/04/2013 Time: 0850-0910 PT Time Calculation (min): 20 min  PT Assessment / Plan / Recommendation Clinical Impression  Pt presents with decreased balance, impaired gait, impaired cognition, difficulty following precautions and will benefit from acute care PT services.    PT Assessment  Patient needs continued PT services    Follow Up Recommendations  Home health PT       Barriers to Discharge Decreased caregiver support Pt currently at independent living apartment    Equipment Recommendations    none   Recommendations for Other Services Speech consult;OT consult   Frequency Min 5X/week    Precautions / Restrictions Precautions Precautions: Fall Restrictions Weight Bearing Restrictions: Yes RUE Weight Bearing: Non weight bearing LUE Weight Bearing: Non weight bearing   Pertinent Vitals/Pain No c/o pain      Mobility  Bed Mobility Bed Mobility: Supine to Sit Supine to Sit: 4: Min assist Details for Bed Mobility Assistance: Min A for trunk due to NWB status Transfers Transfers: Sit to [employer] Sit to Stand: 4: Min [employer] Transfers: 4: Min assist Details for Transfer Assistance: needs assistance to lift due to NWB on UEs, total cues for wt bearing precautions Ambulation/Gait Ambulation/Gait Assistance: 4: Min guard Ambulation Distance (Feet): 200 Feet Assistive device: None Ambulation/Gait Assistance Details: able to gait fwd/backward without LOB, steadying assist due to increased sway, cues for attention and orientation to place/situation Stairs: Yes Stairs Assistance: 4: Min assist Stairs Assistance Details (indicate cue type and reason): assist due to NWB UEs Stair Management Technique: No rails Number of Stairs: 3        PT Diagnosis: Difficulty walking;Generalized weakness;Altered mental status  PT Problem  List: Decreased strength;Decreased mobility;Decreased cognition;Decreased balance;Decreased knowledge of precautions;Decreased safety awareness;Decreased coordination PT Treatment Interventions: Gait training;Stair training;Functional mobility training;Therapeutic activities;Patient/family education;Neuromuscular re-education;Balance training;DME instruction;Therapeutic exercise;Modalities;Cognitive remediation   PT Goals Acute Rehab PT Goals PT Goal Formulation: With patient/family Time For Goal Achievement: 02/18/13 Potential to Achieve Goals: Good Pt will go Supine/Side to Sit: with supervision PT Goal: Supine/Side to Sit - Progress: Goal set today Pt will Transfer Bed to Chair/Chair to Bed: with supervision PT Transfer Goal: Bed to Chair/Chair to Bed - Progress: Goal set today Pt will Stand: with supervision PT Goal: Stand - Progress: Goal set today Pt will Ambulate: 51 - 150 feet;with supervision PT Goal: Ambulate - Progress: Goal set today  Visit Information  Last PT Received On: 02/04/13 Assistance Needed: +1    Subjective Data  Subjective: "I'm ok"   Prior Functioning  Home Living Lives With: Alone Available Help at Discharge: Available PRN/intermittently Type of Home: Independent living facility Home Access: Level entry Home Layout: One level Prior Function Level of Independence: Independent Driving: No Communication Communication: No difficulties    Cognition  Cognition Overall Cognitive Status: History of cognitive impairments - at baseline Arousal/Alertness: Awake/alert Orientation Level: Person Cognition - Other Comments: Pt unable to maintain wt bearing precautions without total cues    Extremity/Trunk Assessment Right Lower Extremity Assessment RLE ROM/Strength/Tone: Within functional levels Left Lower Extremity Assessment LLE ROM/Strength/Tone: Within functional levels Trunk Assessment Trunk Assessment: Normal   Balance Dynamic Standing  Balance Dynamic Standing - Balance Support: During functional activity Dynamic Standing - Level of Assistance: 4: Min assist  End of Session PT - End of Session Equipment Utilized During Treatment: Gait belt Activity Tolerance: Patient tolerated treatment well Patient left: in chair;with chair alarm  set;with family/visitor present (son and sitter present)       DONAWERTH,KAREN 02/04/2013, 11:13 AM

## 2013-02-04 NOTE — Progress Notes (Signed)
Will check x-ray right knee I spoke to her son at the bedside Plan SNF placement Patient examined and I agree with the assessment and plan  Violeta Gelinas, MD, MPH, FACS Pager: [phone]  02/04/2013 11:38 AM

## 2013-02-04 NOTE — Progress Notes (Signed)
Patient stable.  OK to mobilize and transfer to SNF per Trauma service.

## 2013-02-04 NOTE — Progress Notes (Signed)
S:  Pt has some wrist pain  O:Blood pressure 128/65, pulse 99, temperature 98 F (36.7 C), temperature source Oral, resp. rate 18, height 5\' 4"  (1.626 m), weight 73.3 kg (161 lb 9.6 oz), SpO2 97.00%. Results for orders placed during the hospital encounter of 02/02/13  MRSA PCR SCREENING     Status: None   Collection Time    02/02/13  3:40 PM      Result Value Range Status   MRSA by PCR NEGATIVE  NEGATIVE Final   Comment:            The GeneXpert MRSA Assay (FDA     approved for NASAL specimens     only), is one component of a     comprehensive MRSA colonization     surveillance program. It is not     intended to diagnose MRSA     infection nor to guide or     monitor treatment for     MRSA infections.   Splints intact, moves all fingers, grossly intact   A:s/p closed reduction of L wrist/splint; s/p ORIF R wrist   P:elevate, move fingers, nwb bilateral, may bear weight (little!) on Right

## 2013-02-04 NOTE — Progress Notes (Signed)
Subjective: Patient reports "I think I'm doing better."  Objective: Vital signs in last 24 hours: Temp:  [97.8 F (36.6 C)-99.8 F (37.7 C)] 99.8 F (37.7 C) (03/10 1610) Pulse Rate:  [95-109] 98 (03/10 0613) Resp:  [14-20] 17 (03/10 0613) BP: (141-185)/(66-94) 148/94 mmHg (03/10 0613) SpO2:  [98 %-100 %] 98 % (03/10 0613)  Intake/Output from previous day: 03/09 0701 - 03/10 0700 In: 1642.5 [P.O.:130; I.V.:1512.5] Out: 710 [Urine:700; Blood:10] Intake/Output this shift:    Alert, sitting in chair. Confused re: place & time. Hx Dementia per son (present); some possible increase in confusion since fall from her "ususal" per son. Splints bilat wrists. Right facial bruising no worse.     Lab Results:  Recent Labs  02/02/13 0529 02/03/13 0452  WBC 18.8* 8.5  HGB 15.3* 14.0  HCT 44.3 41.1  PLT 282 243   BMET  Recent Labs  02/02/13 0529 02/03/13 0452  NA 140 138  K 4.1 4.1  CL 101 102  CO2 29 28  GLUCOSE 145* 147*  BUN 10 8  CREATININE 0.71 0.63  CALCIUM 9.6 8.9    Studies/Results: Ct Head Without Contrast  02/03/2013  *RADIOLOGY REPORT*  Clinical Data: Follow-up traumatic brain injury  CT HEAD WITHOUT CONTRAST  Technique:  Contiguous axial images were obtained from the base of the skull through the vertex without contrast.  Comparison: CT 02/02/2013  Findings: Right frontal temporal subdural hematoma measures 5 mm and is unchanged from yesterday.  No new area of hemorrhage.  No midline shift.  There is mild atrophy.  No acute infarct or mass.  Negative for skull fracture.  Soft tissue swelling overlying the right face.  IMPRESSION: 5 mm right-sided subdural hematoma is unchanged.  No new findings since yesterday.   Original Report Authenticated By: Janeece Riggers, M.D.     Assessment/Plan:   LOS: 2 days  Continue to mobilize with PT as tolerated.  Unable to return to independent living facility with current level confusion/injuries.   [name] 02/04/2013, 9:48  AM

## 2013-02-04 NOTE — Op Note (Signed)
Crystal Davila, Crystal Davila              ACCOUNT NO.:  [id number]  MEDICAL RECORD NO.:  [id number]  LOCATION:  [id number]                        FACILITY:  [facility]  PHYSICIAN:  Johnette Abraham, MD    DATE OF BIRTH:  19-Oct-1946  DATE OF PROCEDURE:  02/03/2013 DATE OF DISCHARGE:                              OPERATIVE REPORT   PREOPERATIVE DIAGNOSIS:  Bilateral distal radius fractures.  POSTOPERATIVE DIAGNOSIS:  Bilateral distal radius fractures.  PROCEDURE: 1. Closed reduction with manipulation and anesthesia of the left     distal radius. 2. Open reduction, internal fixation of the right distal radius with a     [employer]_ [employer] DVR plate.  ANESTHESIA:  General.  POSTOPERATIVE CONDITION:  Stable.  BLOOD LOSS:  Minimal.  INDICATIONS:  Ms. Hecker is a pleasant female who fell down the steps, sustaining bilateral closed distal radius fractures.  The right was the more severe fracture.  She was cleared with the trauma and Neurosurgery due to some head trauma and then consented for fixation of her fractures.  Consent was obtained after speaking with her son.  PROCEDURE:  The patient was taken to the operating room, placed supine on the operating room table.  All extremities were padded.  General anesthesia was administered without difficulty.  Beginning on the left, x-ray was brought into view.  In-line traction and dorsal pressure and flexion of the wrist were performed reducing the fracture to a near anatomic alignment.  A dorsal and volar splint were placed.  Additional x-ray was performed after placement of splint to confirm maintenance of reduction which was confirmed.  Next, the right upper extremity was addressed.  The hand was prepped and draped in normal sterile fashion. Tourniquet was used and inflated to 250 mmHg.  A volar incision was made overlying the FCR tendon.  Dissection was carried down to the pronator quadratus muscle.  The muscle was partially  obliterated due to the fracture.  Remaining muscle was elevated.  The fracture site was exposed.  It was comminuted and intra-articular.  After in-line traction, reduction was performed and appropriate size [employer] DVR plate was chosen and held in place with K-wires.  X-ray revealed good plate placement, and therefore the radial shaft screws were each drilled, measured, and cortical screws were placed.  The most distal cortical screw was replaced with a shorter one due to a bony defect dorsally. Following the distal radial screws were placed, each was drilled, measured, and appropriate sized locking screws were placed.  Afterwards fluoroscopy revealed good plate placement and good screw length.  Wound was then irrigated and the deep muscle layer was approximated over the plate.  Subcutaneous layers were closed with 3-0 Vicryl and the skin was run with 4-0 Monocryl.  A volar and dorsal plate were placed.  The patient tolerated the procedure well and was taken to recovery room in stable condition.     Johnette Abraham, MD     HCC/MEDQ  D:  02/03/2013  T:  02/04/2013  Job:  [id number]

## 2013-02-04 NOTE — Clinical Social Work Psychosocial (Signed)
     Clinical Social Work Department BRIEF PSYCHOSOCIAL ASSESSMENT 02/04/2013  Patient:  Crystal Davila, Crystal Davila     Account Number:  [id number]     Admit date:  02/02/2013  Clinical Social Worker:  Verl Blalock  Date/Time:  02/04/2013 11:00 AM  Referred by:  RN  Date Referred:  02/04/2013 Referred for  SNF Placement   Other Referral:   Interview type:  Family Other interview type:   Patient involved in conversation, however with dementia diagnosis son is Museum/gallery curator.    PSYCHOSOCIAL DATA Living Status:  FRIEND(S) Admitted from facility:   Level of care:  Independent Living Primary support name:  Landry Lookingbill (son) -  [phone] Primary support relationship to patient:  CHILD, ADULT Degree of support available:   Strong    CURRENT CONCERNS Current Concerns  Post-Acute Placement   Other Concerns:    SOCIAL WORK ASSESSMENT / PLAN Clinical Social Worker met with patient and patient son at bedside to offer support and discuss patient needs at discharge.  Patient son states that patient has been living at [facility] with daily assistance from her son.  Patient son is aware that patient will not be able to return to the same environment and is agreeable to [facility] search.  CSW to complete FL2 and initiate SNF search in the [place] area.  Due to patient current wandering habits, CSW explained to patient son that options may be limited and that a facility with a wander guard system may be ideal.  CSW to follow up with patient son to provide bed offers once available.  No SBIRT completed with patient due to current mental status, however patient son states there are no current issues with patient alcohol intake.    CSW remains available for support and to facilitate patient discharge needs once medically stable.   Assessment/plan status:  Psychosocial Support/Ongoing Assessment of Needs Other assessment/ plan:   Information/referral to community  resources:   Visual merchandiser provided patient son with contact information for questions/concerns.  Patient son understanding of CSW role and will request resources if necessary.    PATIENTS/FAMILYS RESPONSE TO PLAN OF CARE: Patient alert and oriented, however with dementia diagnosis gets easily confused.  Patient and patient son both agreeable to rehab process prior to possible ALF placement long term.  Patient son expressed some concerns regarding ongoing family issues with decision making - CSW to further explore.  Patient son expressed his gratitude for CSW support and concern.

## 2013-02-04 NOTE — Progress Notes (Signed)
Patient ID: Crystal Davila, female   DOB: 1946-11-24, 67 y.o.   MRN: [id number]   LOS: 2 days   Subjective: No significant c/o. Son reports she c/o knee pain, on further exam did have some TTP right patella.   Objective: Vital signs in last 24 hours: Temp:  [97.8 F (36.6 C)-99.8 F (37.7 C)] 99.8 F (37.7 C) (03/10 2130) Pulse Rate:  [95-109] 98 (03/10 0613) Resp:  [14-20] 17 (03/10 0613) BP: (141-185)/(66-94) 148/94 mmHg (03/10 0613) SpO2:  [98 %-100 %] 98 % (03/10 0613) Last BM Date:  (PTA)   Laboratory  CBC  Recent Labs  02/02/13 0529 02/03/13 0452  WBC 18.8* 8.5  HGB 15.3* 14.0  HCT 44.3 41.1  PLT 282 243   BMET  Recent Labs  02/02/13 0529 02/03/13 0452  NA 140 138  K 4.1 4.1  CL 101 102  CO2 29 28  GLUCOSE 145* 147*  BUN 10 8  CREATININE 0.71 0.63  CALCIUM 9.6 8.9     Physical Exam General appearance: alert and no distress Resp: clear to auscultation bilaterally Cardio: regular rate and rhythm GI: normal findings: bowel sounds normal and soft, non-tender Extremities: BUE NVI, TTP superior pole right patella   Assessment/Plan: Fall TBI w/SDH, SAH -- Stable Bilateral wrist fxs s/p CR left, ORIF right Right knee pain -- check x-ray Dementia -- Home meds FEN -- Avoid narcotics VTE -- SCD's, Lovenox Dispo -- Needs SNF. Will change to camera room, d/c sitter to facilitate d/c.   Freeman Caldron, PA-C Pager: [phone] General Trauma PA Pager: [phone]   02/04/2013

## 2013-02-05 MED ORDER — TRAMADOL HCL 50 MG PO TABS: 50.0000 mg | ORAL_TABLET | Freq: Four times a day (QID) | ORAL | Status: DC | PRN

## 2013-02-05 MED ORDER — ALPRAZOLAM 0.25 MG PO TABS: 0.2500 mg | ORAL_TABLET | Freq: Every evening | ORAL | Status: DC | PRN

## 2013-02-05 NOTE — Progress Notes (Signed)
Physical Therapy Treatment Patient Details Name: Crystal Davila MRN: [id number] DOB: 1946-10-02 Today's Date: 02/05/2013 Time: 1020-1101 PT Time Calculation (min): 41 min  PT Assessment / Plan / Recommendation Comments on Treatment Session  Lengthy discussion with pt son regarding his concerns about pt D/C plan; He does state that SW is aware of issues; pt is progressing; may need STSNF due to decreased independence with ADLs (UE fx/casts), decreased balance reactions, safety with gait and increased risk for falls    Follow Up Recommendations  Supervision/Assistance - 24 hour;SNF     Does the patient have the potential to tolerate intense rehabilitation     Barriers to Discharge        Equipment Recommendations  None recommended by PT    Recommendations for Other Services OT consult  Frequency Min 5X/week   Plan Discharge plan needs to be updated;Frequency remains appropriate    Precautions / Restrictions Precautions Precautions: Fall Restrictions RUE Weight Bearing: Non weight bearing LUE Weight Bearing: Non weight bearing   Pertinent Vitals/Pain Denies pain    Mobility  Bed Mobility Bed Mobility: Not assessed Transfers Transfers: Sit to Stand;Stand to Sit Sit to Stand: 4: Min guard;Other (comment);Without upper extremity assist;From chair/3-in-1 (low bench in hallway) Stand to Sit: 4: Min guard;Other (comment);To chair/3-in-1;Without upper extremity assist (bench in hallway) Details for Transfer Assistance: min/guard for balance, verbal and tactile cues to avoid use of UEs Ambulation/Gait Ambulation/Gait Assistance: 4: Min guard Ambulation Distance (Feet): 220 Feet (X 2) Assistive device: None Ambulation/Gait Assistance Details: cues for continuous steps and negotiation of obstacles, safety; LOB x2 with min to min /guard to recover Gait Pattern: Step-through pattern;Decreased step length - left;Decreased trunk rotation;Step-to pattern Gait velocity: decr General  Gait Details: see balance for gait activities    Exercises Total Joint Exercises Ankle Circles/Pumps: AROM;5 reps;Both Quad Sets: AROM;Both;5 reps Straight Leg Raises: AROM;AAROM;Both;5 reps (requires max cues for ther ex)   PT Diagnosis:    PT Problem List:   PT Treatment Interventions:     PT Goals Acute Rehab PT Goals Time For Goal Achievement: 02/18/13 Potential to Achieve Goals: Good Pt will go Sit to Stand: with supervision;without upper extremity assist;Other (comment) (from low surfaces) PT Goal: Sit to Stand - Progress: Goal set today Pt will go Stand to Sit: with supervision;without upper extremity assist;Other (comment) (from low surfaces) PT Goal: Stand to Sit - Progress: Goal set today Pt will Stand: with supervision;3 - 5 min;with no upper extremity support PT Goal: Stand - Progress: Other (comment) (updated as goal not complete) Pt will Ambulate: with supervision;>150 feet;with cues (comment type and amount) (subtle cues for safe neg obstacles) PT Goal: Ambulate - Progress: Updated due to goal met  Visit Information  Last PT Received On: 02/05/13 Assistance Needed: +1    Subjective Data  Subjective: pt up in chair, son present Patient Stated Goal: pt unable; son wants rehab   Cognition  Cognition Overall Cognitive Status: History of cognitive impairments - at baseline Arousal/Alertness: Awake/alert Orientation Level: Disoriented to;Time;Place Behavior During Session: [facility] for tasks performed    Balance  High Level Balance High Level Balance Activites: Side stepping;Backward walking;Direction changes;Turns;Head turns;Other (comment)  End of Session PT - End of Session Equipment Utilized During Treatment: Gait belt Activity Tolerance: Patient tolerated treatment well Patient left: in chair;with chair alarm set;with family/visitor present   GP     [facility] 02/05/2013, 11:20 AM

## 2013-02-05 NOTE — Progress Notes (Signed)
UR completed 

## 2013-02-05 NOTE — Progress Notes (Signed)
Patient ID: Crystal Davila, female   DOB: 08/07/1946, 67 y.o.   MRN: [id number]   LOS: 3 days   Subjective: No new c/o. Note RN note about left knee. Pt w/o sig c/o this am w/r/t knee.  Objective: Vital signs in last 24 hours: Temp:  [97.4 F (36.3 C)-98 F (36.7 C)] 97.4 F (36.3 C) (03/11 0644) Pulse Rate:  [93-99] 93 (03/11 0644) Resp:  [16-18] 18 (03/11 0644) BP: (115-141)/(64-65) 141/65 mmHg (03/11 0644) SpO2:  [96 %-97 %] 96 % (03/11 0644) Last BM Date: 02/04/13   Radiology Results CT left knee: Pending   Physical Exam General appearance: alert and no distress Resp: clear to auscultation bilaterally Cardio: regular rate and rhythm GI: normal findings: bowel sounds normal and soft, non-tender Extremities: Left knee: Min general TTP, no pain with PROM, Lachmann neg   Assessment/Plan: Fall  TBI w/SDH, [facility] -- Stable  Bilateral wrist fxs s/p CR left, ORIF right  Bilateral knee pain -- CT reading pending but I don't see anything obvious for right knee. Possibly bruised left knee but I'm not concerned for fracture or ligamentous disruption. Ambulated 200' yesterday. Dementia -- Home meds  FEN -- Avoid narcotics  VTE -- SCD's, Lovenox  Dispo -- SNF when bed available    Freeman Caldron, PA-C Pager: [phone] General Trauma PA Pager: [phone]   02/05/2013

## 2013-02-05 NOTE — Progress Notes (Signed)
Patient stable neurologically. Mobilize and disposition per Trauma service.

## 2013-02-05 NOTE — Clinical Social Work Note (Signed)
Clinical Social Worker provided patient son with bed offers - patient son has chosen [facility] and is prepared for patient to discharge today.  CSW facilitated patient discharge including contacting facility and family to confirm patient discharge plans and arrange transportation to [facility] with patient son.    Clinical Social Worker will sign off for now as social work intervention is no longer needed.  Macario Golds, [place] [phone]

## 2013-02-05 NOTE — Progress Notes (Signed)
Hopeful for SNF placement today - son is D/W CSW now. Patient examined and I agree with the assessment and plan  Violeta Gelinas, MD, MPH, FACS Pager: [phone]  02/05/2013 11:24 AM

## 2013-02-05 NOTE — Discharge Summary (Signed)
Physician Discharge Summary  Patient ID: SHAYLEEN EPPINGER MRN: [id number] DOB/AGE: 1946-04-25 67 y.o.  Admit date: 02/02/2013 Discharge date: 02/05/2013  Discharge Diagnoses Patient Active Problem List   Diagnosis Date Noted  . Fall 02/04/2013  . Left wrist fracture 02/04/2013  . Traumatic subdural hematoma 02/04/2013  . Traumatic subarachnoid hemorrhage 02/04/2013  . Dementia 02/04/2013  . Right wrist fracture     Consultants Dr. Ronalee Belts for hand surgery  Dr. Maeola Harman for neurosurgery   Procedures ORIF of right distal radius fracture and CR of left distal radius fracture by Dr. Izora Ribas   HPI: Mayo apparently fell down some stairs at the senior living facility where she resides. She was unable to contribute much to the history. She broke her fall with her hands and did strike her head but no history was given of loss of consciousness. Her workup included a CT scan of her head that showed the brain injury and multiple extremity films that showed her wrist fractures. She was admitted by the trauma service and neurosurgery and hand surgery were consulted.   Hospital Course: Neurosurgery recommended observation for her brain injury. She was apparently as baseline from a mental status standpoint during her entire hospital stay given her premorbid dementia. A repeat head CT was stable and she was cleared for surgery. Hand surgery then took her to the OR for repair of her bilateral wrist fractures. Following that she was mobilized with physical and occupational therapy who recommended placement at a skilled nursing facility. She did complain of some knee pain but x-rays ruled out acute fractures. She was transferred to the SNF in good condition.      Medication List    TAKE these medications       ALPRAZolam 0.25 MG tablet  Commonly known as:  XANAX  Take 0.25 mg by mouth at bedtime as needed for sleep.     donepezil 10 MG tablet  Commonly known as:  ARICEPT  Take  10 mg by mouth at bedtime as needed.     memantine 10 MG tablet  Commonly known as:  NAMENDA  Take 10 mg by mouth 2 (two) times daily.     traMADol 50 MG tablet  Commonly known as:  ULTRAM  Take 1-2 tablets (50-100 mg total) by mouth every 6 (six) hours as needed.     traZODone 50 MG tablet  Commonly known as:  DESYREL  Take 25-50 mg by mouth at bedtime as needed for sleep.             Follow-up Information   Schedule an appointment as soon as possible for a visit with Molinda Bailiff, MD.   Contact information:   [address] [place]. [address] [place] [address] [phone]       Call [facility]. (As needed)    Contact information:   [address] [place] [address] [phone]       Signed: Freeman Caldron, PA-C Pager: [phone] General Trauma PA Pager: [phone]  02/05/2013, 1:20 PM

## 2013-02-05 NOTE — Progress Notes (Signed)
Subjective: Patient reports "I'm ok. Just sore under my eye"  Objective: Vital signs in last 24 hours: Temp:  [97.4 F (36.3 C)-98 F (36.7 C)] 97.4 F (36.3 C) (03/11 0644) Pulse Rate:  [93-99] 93 (03/11 0644) Resp:  [16-18] 18 (03/11 0644) BP: (115-141)/(64-65) 141/65 mmHg (03/11 0644) SpO2:  [96 %-97 %] 96 % (03/11 0644)  Intake/Output from previous day: 03/10 0701 - 03/11 0700 In: 480 [P.O.:480] Out: 1 [Stool:1] Intake/Output this shift:    Awake, responds to questions. Dementia. Reports soreness beneath right eye, rubbing bruised area. PEARL. Son present reports evaluations by ortho/trauma re:leg swelling/pain.   Lab Results:  Recent Labs  02/03/13 0452  WBC 8.5  HGB 14.0  HCT 41.1  PLT 243   BMET  Recent Labs  02/03/13 0452  NA 138  K 4.1  CL 102  CO2 28  GLUCOSE 147*  BUN 8  CREATININE 0.63  CALCIUM 8.9    Studies/Results: Dg Knee Complete 4 Views Right  02/04/2013  *RADIOLOGY REPORT*  Clinical Data: Right knee pain since a fall 2 weeks ago.  RIGHT KNEE - COMPLETE 4+ VIEW  Comparison: None.  Findings: There is a sclerotic area deep to the medial tibial plateau which I suspect represents an old or subacute insufficiency fracture.  There is only a small joint effusion.  Slight degenerative changes of the patellofemoral compartment.  Slight marginal spurring of the lateral femoral condyle.  IMPRESSION: Old or subacute insufficiency or subtle impaction fracture of the lateral tibial plateau. CT scan without contrast may better determine the age of the finding.   Original Report Authenticated By: [name], M.D.     Assessment/Plan: Stable from NS standpoint.   LOS: 3 days  From NS standpoint, ok for SNF when cleared by Trauma.   Crystal Davila 02/05/2013, 8:32 AM

## 2013-02-05 NOTE — Evaluation (Signed)
Occupational Therapy Evaluation Patient Details Name: Crystal Davila MRN: [id number] DOB: 1946/02/20 Today's Date: 02/05/2013 Time: 4098-1191 OT Time Calculation (min): 23 min  OT Assessment / Plan / Recommendation Clinical Impression  Pleasant 67 yr old female with history of dementia admitted after fall resulting in bilateral wrist fractures.  Pt underwent ORIF on the right wrist and just closed reduction on the left.  Pt currently demonstrates slight decreased independence with basic selfcare tasks at this time and will benefit from continued OT at SNF to reach modified independent level. Per SW pt is discharging later today.    OT Assessment  All further OT needs can be met in the next venue of care    Follow Up Recommendations  SNF       Equipment Recommendations  None recommended by OT          Precautions / Restrictions Precautions Precautions: Fall Restrictions Weight Bearing Restrictions: Yes RUE Weight Bearing: Non weight bearing LUE Weight Bearing: Non weight bearing   Pertinent Vitals/Pain Pain 2/10 on the faces scale, pt repositioned and emotional support given    ADL  Eating/Feeding: Simulated;Set up Where Assessed - Eating/Feeding: Chair Grooming: Performed;Supervision/safety;Wash/dry hands;Teeth care;Brushing hair Where Assessed - Grooming: Unsupported standing Upper Body Bathing: Simulated;Supervision/safety Where Assessed - Upper Body Bathing: Unsupported sitting Lower Body Bathing: Simulated;Min guard Where Assessed - Lower Body Bathing: Unsupported sit to stand Upper Body Dressing: Simulated;Minimal assistance Where Assessed - Upper Body Dressing: Unsupported sitting Lower Body Dressing: Performed;Min guard Where Assessed - Lower Body Dressing: Unsupported sit to stand Toilet Transfer: Performed;Min guard Toilet Transfer Method: Other (comment) (ambulate without assistive device) Acupuncturist: Comfort height toilet Toileting -  Clothing Manipulation and Hygiene: Performed;Minimal assistance Where Assessed - Engineer, mining and Hygiene: Sit to stand from 3-in-1 or toilet Tub/Shower Transfer Method: Not assessed Transfers/Ambulation Related to ADLs: Pt is overall min guard assist for mobility without use of assistive device. ADL Comments: Pt with decreased ability to perform FM tasks such as buttoning buttons or removing cap from the toothpaste, however does demonstrate good gross grasp and functional use of her hands to perform some dressing tasks.  Will need 24 hour supervision for safety and setup for ADLs.     OT Problem List: Decreased strength;Decreased coordination;Decreased safety awareness;Impaired UE functional use    Visit Information  Last OT Received On: 02/05/13 Assistance Needed: +1    Subjective Data  Subjective: I fell trying to put my coat on. Patient Stated Goal: Pt did not state.   Prior Functioning     Home Living Lives With: Alone Available Help at Discharge: Available PRN/intermittently Type of Home: Independent living facility Home Access: Level entry Home Layout: One level Prior Function Level of Independence: Independent Driving: No Communication Communication: No difficulties         Vision/Perception Vision - History Baseline Vision: No visual deficits Patient Visual Report: No change from baseline Vision - Assessment Vision Assessment: Vision not tested Perception Perception: Within Functional Limits Praxis Praxis: Intact   Cognition  Cognition Overall Cognitive Status: History of cognitive impairments - at baseline Orientation Level: Disoriented to;Place;Time Behavior During Session: [facility] for tasks performed    Extremity/Trunk Assessment Right Upper Extremity Assessment RUE ROM/Strength/Tone: Deficits RUE ROM/Strength/Tone Deficits: Pt in forearm cast from distal elbow to MP joints.  Gross full finger flexion and extension within  limitations of the cast noted.  Pt also with noted arthritic changes in her fingers. All other joints AROM WFLS and strength WFLS  for ADLS.   RUE Sensation: WFL - Light Touch RUE Coordination: Deficits RUE Coordination Deficits: Decreased FM coordination for fastening and buttoning secondary to cast limitations. Left Upper Extremity Assessment LUE ROM/Strength/Tone: Deficits LUE ROM/Strength/Tone Deficits: Pt in forearm cast from distal elbow to MP joints.  Gross full finger flexion and extension within limitations of the cast noted.  Pt also with noted arthritic changes in her fingers. All other joints AROM WFLS and strength WFLS for ADLS.  LUE Sensation: WFL - Light Touch LUE Coordination: Deficits LUE Coordination Deficits: Decreased FM coordination for fastening and buttoning secondary to cast limitations. Trunk Assessment Trunk Assessment: Normal     Mobility Bed Mobility Bed Mobility: Supine to Sit Supine to Sit: 6: Modified independent (Device/Increase time) Transfers Transfers: Sit to Stand Sit to Stand: 4: Min guard;Without upper extremity assist;From bed Stand to Sit: 4: Min guard;Without upper extremity assist;To toilet        Balance Balance Balance Assessed: Yes Dynamic Standing Balance Dynamic Standing - Balance Support: No upper extremity supported Dynamic Standing - Level of Assistance: 4: Min assist;Other (comment) (min guard)   End of Session OT - End of Session Equipment Utilized During Treatment: Gait belt Activity Tolerance: Patient tolerated treatment well Patient left: in chair;with call bell/phone within reach;with chair alarm set;with family/visitor present Nurse Communication: Mobility status     MCGUIRE,JAMES OTR/L Pager number [phone] 02/05/2013, 3:16 PM

## 2013-02-05 NOTE — Progress Notes (Signed)
Pt son Molli Hazard is concerned that pt is having pain in her L knee as well.  Pt agreed that she is having pain in the L knee, there is some bruising to the area.  Will pass on to day shift, will continue to monitor.

## 2013-02-05 NOTE — Discharge Summary (Signed)
Burke Thompson, MD, MPH, FACS Pager: [phone]  

## 2013-02-06 ENCOUNTER — Telehealth (HOSPITAL_COMMUNITY): Payer: Self-pay | Admitting: Emergency Medicine

## 2013-02-06 ENCOUNTER — Telehealth (INDEPENDENT_AMBULATORY_CARE_PROVIDER_SITE_OTHER): Payer: Self-pay | Admitting: General Surgery

## 2013-02-06 NOTE — Telephone Encounter (Signed)
Pt's son called for advice and assistance for his mother, who he has removed from assisted care/ nursing home.  Pt was discharged with casts on both her wrists (trauma service.)  When the son went in to see her today, he found both the casts had been removed.  He contacted Dr. Izora Ribas and now Dr. Janee Morn, who had operated on her.  Advised son to call [facility] for all concerns, at [phone], as they have all her records of care.  He understands and will comply.

## 2013-02-06 NOTE — Telephone Encounter (Signed)
Son brought mother to ED before I could call.

## 2013-05-20 ENCOUNTER — Emergency Department (HOSPITAL_COMMUNITY)
Admission: EM | Admit: 2013-05-20 | Discharge: 2013-05-21 | Disposition: A | Discharge status: Home / Self Care | Payer: Medicare Other | Attending: Emergency Medicine | Admitting: Emergency Medicine

## 2013-05-20 ENCOUNTER — Encounter (HOSPITAL_COMMUNITY): Payer: Self-pay | Admitting: Emergency Medicine

## 2013-05-20 DIAGNOSIS — S0501XA Injury of conjunctiva and corneal abrasion without foreign body, right eye, initial encounter: Secondary | ICD-10-CM

## 2013-05-20 DIAGNOSIS — Y929 Unspecified place or not applicable: Secondary | ICD-10-CM | POA: Insufficient documentation

## 2013-05-20 DIAGNOSIS — Y939 Activity, unspecified: Secondary | ICD-10-CM | POA: Insufficient documentation

## 2013-05-20 DIAGNOSIS — X58XXXA Exposure to other specified factors, initial encounter: Secondary | ICD-10-CM | POA: Insufficient documentation

## 2013-05-20 DIAGNOSIS — Z79899 Other long term (current) drug therapy: Secondary | ICD-10-CM | POA: Insufficient documentation

## 2013-05-20 DIAGNOSIS — S058X9A Other injuries of unspecified eye and orbit, initial encounter: Secondary | ICD-10-CM | POA: Insufficient documentation

## 2013-05-20 MED ORDER — TETRACAINE HCL 0.5 % OP SOLN
2.0000 [drp] | Freq: Once | OPHTHALMIC | Status: AC
  Administered 2013-05-20: 2 [drp] via OPHTHALMIC
  Filled 2013-05-20: qty 2

## 2013-05-20 MED ORDER — FLUORESCEIN SODIUM 1 MG OP STRP
1.0000 | ORAL_STRIP | Freq: Once | OPHTHALMIC | Status: AC
  Administered 2013-05-20: 1 via OPHTHALMIC
  Filled 2013-05-20: qty 1

## 2013-05-20 NOTE — ED Notes (Signed)
PT. REPORTS RIGHT EYE PAIN / SWELLING WITH DRAINAGE FOR 2 DAYS DENIES INJURY, NO BLURRED VISION.

## 2013-05-21 MED ORDER — ERYTHROMYCIN 5 MG/GM OP OINT
TOPICAL_OINTMENT | Freq: Once | OPHTHALMIC | Status: AC
  Administered 2013-05-21: 1 via OPHTHALMIC
  Filled 2013-05-21: qty 1

## 2013-05-21 MED ORDER — ERYTHROMYCIN 5 MG/GM OP OINT: TOPICAL_OINTMENT | Freq: Four times a day (QID) | OPHTHALMIC | Status: DC

## 2013-05-21 NOTE — ED Provider Notes (Signed)
Medical screening examination/treatment/procedure(s) were performed by non-physician practitioner and as supervising physician I was immediately available for consultation/collaboration.   Masai Kidd, MD 05/21/13 0606 

## 2013-05-21 NOTE — ED Provider Notes (Signed)
History    CSN: [id number] Arrival date & time 05/20/13  2118  First MD Initiated Contact with Patient 05/20/13 2340     Chief Complaint  Patient presents with  . Conjunctivitis   (Consider location/radiation/quality/duration/timing/severity/associated sxs/prior Treatment) HPI Comments: Today after being showered at her facility had R eye irritation that has persisted and gotten worse Her facility called her son to bring her to the ED for evaluation at 9PM   Patient is a 67 y.o. female presenting with conjunctivitis. The history is provided by the patient and a relative.  Conjunctivitis This is a new problem. The current episode started today. The problem occurs constantly. The problem has been gradually worsening. Pertinent negatives include no chills, congestion, fever or headaches. Exacerbated by: blinking. She has tried nothing for the symptoms. The treatment provided no relief.   History reviewed. No pertinent past medical history. Past Surgical History  Procedure Laterality Date  . Orif wrist fracture Bilateral 02/03/2013    Procedure: OPEN REDUCTION INTERNAL FIXATION (ORIF) RIGHT WRIST FRACTURE,  CLOSED REDUCTION LEFT WRIST;  Surgeon: Johnette Abraham, MD;  Location: [facility] OR;  Service: Plastics;  Laterality: Bilateral;   No family history on file. History  Substance Use Topics  . Smoking status: Never Smoker   . Smokeless tobacco: Never Used  . Alcohol Use: No   OB History   Grav Para Term Preterm Abortions TAB SAB Ect Mult Living                 Review of Systems  Constitutional: Negative for fever and chills.  HENT: Negative for congestion, rhinorrhea and ear discharge.   Eyes: Positive for discharge and redness. Negative for photophobia, pain, itching and visual disturbance.  Skin: Negative for wound.  Neurological: Negative for dizziness and headaches.  All other systems reviewed and are negative.    Allergies  Review of patient's allergies indicates no known  allergies.  Home Medications   Current Outpatient Rx  Name  Route  Sig  Dispense  Refill  . ALPRAZolam (XANAX) 0.25 MG tablet   Oral   Take 0.25 mg by mouth 2 (two) times daily as needed for anxiety.         . donepezil (ARICEPT) 10 MG tablet   Oral   Take 10 mg by mouth at bedtime.          Marland Kitchen escitalopram (LEXAPRO) 10 MG tablet   Oral   Take 10 mg by mouth daily.         . Memantine HCl ER (NAMENDA XR) 21 MG CP24   Oral   Take 21 mg by mouth daily.         . traMADol (ULTRAM) 50 MG tablet   Oral   Take 50-100 mg by mouth every 6 (six) hours as needed for pain.         . traZODone (DESYREL) 50 MG tablet   Oral   Take 50 mg by mouth at bedtime.          Marland Kitchen erythromycin ophthalmic ointment   Right Eye   Place into the right eye every 6 (six) hours. For 4 days   3.5 g   0    BP 167/74  Pulse 74  Temp(Src) 98.5 F (36.9 C) (Oral)  Resp 14  SpO2 97% Physical Exam  Nursing note and vitals reviewed. Constitutional: She is oriented to person, place, and time. She appears well-developed and well-nourished.  HENT:  Head: Normocephalic and atraumatic.  Patient is continuously rubbing at her R eye   Eyes: EOM are normal. Pupils are equal, round, and reactive to light. Right eye exhibits discharge. Right conjunctiva is injected. Right conjunctiva has no hemorrhage.  Slit lamp exam:      The right eye shows fluorescein uptake.    Abrasion to cornea surrounding erythema   Musculoskeletal: Normal range of motion.  Neurological: She is alert and oriented to person, place, and time.  Skin: Skin is warm. There is erythema.    ED Course  Procedures (including critical care time) Labs Reviewed - No data to display No results found. 1. Corneal abrasion, right, initial encounter     MDM  Abrasion to cornea at the 10 o;clock location will treat with erythromycin ointment for the next 4 days   Arman Filter, NP 05/21/13 [phone]

## 2013-08-06 ENCOUNTER — Other Ambulatory Visit: Payer: Self-pay

## 2013-08-06 ENCOUNTER — Other Ambulatory Visit: Payer: Self-pay | Admitting: Internal Medicine

## 2013-08-06 DIAGNOSIS — Z1231 Encounter for screening mammogram for malignant neoplasm of breast: Secondary | ICD-10-CM

## 2014-05-22 ENCOUNTER — Encounter (HOSPITAL_COMMUNITY): Payer: Self-pay | Admitting: Emergency Medicine

## 2014-05-22 ENCOUNTER — Emergency Department (HOSPITAL_COMMUNITY): Payer: Medicare Other

## 2014-05-22 ENCOUNTER — Emergency Department (HOSPITAL_COMMUNITY)
Admission: EM | Admit: 2014-05-22 | Discharge: 2014-05-22 | Disposition: A | Discharge status: Home / Self Care | Payer: Medicare Other | Attending: Emergency Medicine | Admitting: Emergency Medicine

## 2014-05-22 DIAGNOSIS — S59909A Unspecified injury of unspecified elbow, initial encounter: Secondary | ICD-10-CM | POA: Diagnosis present

## 2014-05-22 DIAGNOSIS — R296 Repeated falls: Secondary | ICD-10-CM | POA: Diagnosis not present

## 2014-05-22 DIAGNOSIS — Z79899 Other long term (current) drug therapy: Secondary | ICD-10-CM | POA: Diagnosis not present

## 2014-05-22 DIAGNOSIS — Y92009 Unspecified place in unspecified non-institutional (private) residence as the place of occurrence of the external cause: Secondary | ICD-10-CM | POA: Insufficient documentation

## 2014-05-22 DIAGNOSIS — F0390 Unspecified dementia without behavioral disturbance: Secondary | ICD-10-CM | POA: Insufficient documentation

## 2014-05-22 DIAGNOSIS — M25539 Pain in unspecified wrist: Secondary | ICD-10-CM

## 2014-05-22 DIAGNOSIS — W19XXXA Unspecified fall, initial encounter: Secondary | ICD-10-CM

## 2014-05-22 DIAGNOSIS — S59919A Unspecified injury of unspecified forearm, initial encounter: Principal | ICD-10-CM

## 2014-05-22 DIAGNOSIS — Z792 Long term (current) use of antibiotics: Secondary | ICD-10-CM | POA: Insufficient documentation

## 2014-05-22 DIAGNOSIS — S6990XA Unspecified injury of unspecified wrist, hand and finger(s), initial encounter: Principal | ICD-10-CM

## 2014-05-22 DIAGNOSIS — Y9389 Activity, other specified: Secondary | ICD-10-CM | POA: Insufficient documentation

## 2014-05-22 HISTORY — DX: Unspecified dementia, unspecified severity, without behavioral disturbance, psychotic disturbance, mood disturbance, and anxiety: F03.90

## 2014-05-22 NOTE — Discharge Instructions (Signed)
Cryotherapy Cryotherapy means treatment with cold. Ice or gel packs can be used to reduce both pain and swelling. Ice is the most helpful within the first 24 to 48 hours after an injury or flareup from overusing a muscle or joint. Sprains, strains, spasms, burning pain, shooting pain, and aches can all be eased with ice. Ice can also be used when recovering from surgery. Ice is effective, has very few side effects, and is safe for most people to use. PRECAUTIONS  Ice is not a safe treatment option for people with:  Raynaud's phenomenon. This is a condition affecting small blood vessels in the extremities. Exposure to cold may cause your problems to return.  Cold hypersensitivity. There are many forms of cold hypersensitivity, including:  Cold urticaria. Red, itchy hives appear on the skin when the tissues begin to warm after being iced.  Cold erythema. This is a red, itchy rash caused by exposure to cold.  Cold hemoglobinuria. Red blood cells break down when the tissues begin to warm after being iced. The hemoglobin that carry oxygen are passed into the urine because they cannot combine with blood proteins fast enough.  Numbness or altered sensitivity in the area being iced. If you have any of the following conditions, do not use ice until you have discussed cryotherapy with your caregiver:  Heart conditions, such as arrhythmia, angina, or chronic heart disease.  High blood pressure.  Healing wounds or open skin in the area being iced.  Current infections.  Rheumatoid arthritis.  Poor circulation.  Diabetes. Ice slows the blood flow in the region it is applied. This is beneficial when trying to stop inflamed tissues from spreading irritating chemicals to surrounding tissues. However, if you expose your skin to cold temperatures for too long or without the proper protection, you can damage your skin or nerves. Watch for signs of skin damage due to cold. HOME CARE INSTRUCTIONS Follow  these tips to use ice and cold packs safely.  Place a dry or damp towel between the ice and skin. A damp towel will cool the skin more quickly, so you may need to shorten the time that the ice is used.  For a more rapid response, add gentle compression to the ice.  Ice for no more than 10 to 20 minutes at a time. The bonier the area you are icing, the less time it will take to get the benefits of ice.  Check your skin after 5 minutes to make sure there are no signs of a poor response to cold or skin damage.  Rest 20 minutes or more in between uses.  Once your skin is numb, you can end your treatment. You can test numbness by very lightly touching your skin. The touch should be so light that you do not see the skin dimple from the pressure of your fingertip. When using ice, most people will feel these normal sensations in this order: cold, burning, aching, and numbness.  Do not use ice on someone who cannot communicate their responses to pain, such as small children or people with dementia. HOW TO MAKE AN ICE PACK Ice packs are the most common way to use ice therapy. Other methods include ice massage, ice baths, and cryo-sprays. Muscle creams that cause a cold, tingly feeling do not offer the same benefits that ice offers and should not be used as a substitute unless recommended by your caregiver. To make an ice pack, do one of the following:  Place crushed ice or   a bag of frozen vegetables in a sealable plastic bag. Squeeze out the excess air. Place this bag inside another plastic bag. Slide the bag into a pillowcase or place a damp towel between your skin and the bag.  Mix 3 parts water with 1 part rubbing alcohol. Freeze the mixture in a sealable plastic bag. When you remove the mixture from the freezer, it will be slushy. Squeeze out the excess air. Place this bag inside another plastic bag. Slide the bag into a pillowcase or place a damp towel between your skin and the bag. SEEK MEDICAL  CARE IF:  You develop white spots on your skin. This may give the skin a blotchy (mottled) appearance.  Your skin turns blue or pale.  Your skin becomes waxy or hard.  Your swelling gets worse. MAKE SURE YOU:   Understand these instructions.  Will watch your condition.  Will get help right away if you are not doing well or get worse. Document Released: 07/11/2011 Document Revised: 02/06/2012 Document Reviewed: 07/11/2011 ExitCare Patient Information 2015 [employer]. This information is not intended to replace advice given to you by your health care provider. Make sure you discuss any questions you have with your health care provider.  

## 2014-05-22 NOTE — ED Notes (Signed)
[facility]  Pt given xanx this morning.  Pt stood up and fell.  Staff there but did not witness if head injury occurred.  Pt not on blood thinners. C/O left wrist pain level 1/2.  Pt with dementia.  Orientation at baseline per staff report to EMS.. Vitals 108/64, hr 60, resp 16, ox 95%.  Denies neck/back pain.  No LOC.  Passed cervical spine

## 2014-05-22 NOTE — ED Provider Notes (Signed)
CSN: [id number]     Arrival date & time 05/22/14  1006 History   First MD Initiated Contact with Patient 05/22/14 1010     Chief Complaint  Patient presents with  . Fall  . Wrist Injury     (Consider location/radiation/quality/duration/timing/severity/associated sxs/prior Treatment) Patient is a 68 y.o. female presenting with fall and wrist injury. The history is provided by the patient. No language interpreter was used.  Fall This is a new problem. The current episode started today. Associated symptoms comments: [facility] after fall that occurred, by report, in front of desk but impact was unwitnessed. The patient complains of wrist pain bilaterally. Patient is unreliable historian due to advanced dementia and is found by nursing home staff to be at her baseline mental status. No wound or bleeding from the fall. .  Wrist Injury   Past Medical History  Diagnosis Date  . Dementia    Past Surgical History  Procedure Laterality Date  . Orif wrist fracture Bilateral 02/03/2013    Procedure: OPEN REDUCTION INTERNAL FIXATION (ORIF) RIGHT WRIST FRACTURE,  CLOSED REDUCTION LEFT WRIST;  Surgeon: Johnette AbrahamHarrill C Coley, MD;  Location: [facility] OR;  Service: Plastics;  Laterality: Bilateral;   History reviewed. No pertinent family history. History  Substance Use Topics  . Smoking status: Never Smoker   . Smokeless tobacco: Never Used  . Alcohol Use: No   OB History   Grav Para Term Preterm Abortions TAB SAB Ect Mult Living                 Review of Systems  Unable to perform ROS: Dementia      Allergies  Tamiflu  Home Medications   Prior to Admission medications   Medication Sig Start Date End Date Taking? Authorizing Provider  acetaminophen (TYLENOL) 500 MG tablet Take 500 mg by mouth every 4 (four) hours as needed for mild pain or fever.   Yes Historical Provider, MD  ALPRAZolam (XANAX) 0.25 MG tablet Take 0.25 mg by mouth every 12 (twelve)  hours as needed for anxiety (and agitation).  02/05/13  Yes Freeman CaldronMichael J. Jeffery, PA-C  alum & mag hydroxide-simeth (MAALOX/MYLANTA) 200-200-20 MG/5ML suspension Take 30 mLs by mouth 4 (four) times daily as needed for indigestion or heartburn.   Yes Historical Provider, MD  divalproex (DEPAKOTE ER) 250 MG 24 hr tablet Take 250 mg by mouth 2 (two) times daily.   Yes Historical Provider, MD  donepezil (ARICEPT) 10 MG tablet Take 10 mg by mouth at bedtime.    Yes Historical Provider, MD  escitalopram (LEXAPRO) 20 MG tablet Take 20 mg by mouth daily.   Yes Historical Provider, MD  guaifenesin (ROBITUSSIN) 100 MG/5ML syrup Take 200 mg by mouth every 6 (six) hours as needed for cough.   Yes Historical Provider, MD  loperamide (IMODIUM) 2 MG capsule Take 2 mg by mouth as needed for diarrhea or loose stools.   Yes Historical Provider, MD  magnesium hydroxide (MILK OF MAGNESIA) 400 MG/5ML suspension Take 30 mLs by mouth at bedtime as needed for mild constipation.   Yes Historical Provider, MD  Memantine HCl ER (NAMENDA XR) 7 MG CP24 Take 1 capsule by mouth daily.   Yes Historical Provider, MD  neomycin-bacitracin-polymyxin (NEOSPORIN) ointment Apply 1 application topically as needed for wound care. apply to eye   Yes Historical Provider, MD  nystatin (MYCOSTATIN/NYSTOP) 100000 UNIT/GM POWD Apply 1 g topically 3 (three) times daily as needed (for abdominal areas).  Yes Historical Provider, MD  traMADol (ULTRAM) 50 MG tablet Take 50 mg by mouth every 6 (six) hours as needed (for pain).  02/05/13  Yes Freeman CaldronMichael J. Jeffery, PA-C   BP 94/66  Temp(Src) 98.7 F (37.1 C) (Oral)  SpO2 99% Physical Exam  Constitutional: She appears well-developed and well-nourished. No distress.  HENT:  Head: Atraumatic.  Neck: Normal range of motion.  Pulmonary/Chest: Effort normal.  Abdominal: Soft. There is no tenderness.  Musculoskeletal: Normal range of motion. She exhibits no edema.  FROM all extremities. Ambulatory without  lower extremity deficits. Wrists are unremarkable in appearance bilaterally. No bony deformities. Minimal tenderness.   Neurological: She is alert. Coordination normal.  Skin: Skin is warm and dry.    ED Course  Procedures (including critical care time) Labs Review Labs Reviewed - No data to display  Imaging Review Dg Wrist Complete Left  05/22/2014   CLINICAL DATA:  Recent traumatic injury with pain  EXAM: LEFT WRIST - COMPLETE 3+ VIEW  COMPARISON:  02/02/2013  FINDINGS: There are changes consistent with a prior distal radial fracture with healing. Some mild posterior angulation at the fracture site is noted. Degenerative changes of the [facility] are seen. No acute fracture or dislocation is noted.  IMPRESSION: Chronic distal radial fracture with healing. No acute abnormality is noted.   Electronically Signed   By: Alcide CleverMark  Lukens M.D.   On: 05/22/2014 11:13   Dg Wrist Complete Right  05/22/2014   CLINICAL DATA:  Traumatic injury and pain  EXAM: RIGHT WRIST - COMPLETE 3+ VIEW  COMPARISON:  02/02/2013  FINDINGS: Degenerative changes of the first [facility] joint are seen. There are changes consistent with prior distal radial fracture with fixation sideplate and multiple fixation screws. No acute soft tissue changes are noted.  IMPRESSION: Degenerative change without acute abnormality. Postsurgical changes are noted as well.   Electronically Signed   By: Alcide CleverMark  Lukens M.D.   On: 05/22/2014 11:14     EKG Interpretation None      MDM   Final diagnoses:  None    1. Fall 2. Dementia 3. Wrist pain without fracture  The patient's condition and mental status are unchanged on re-evaluation. X-rays performed are negative for acute injury. Feel the patient has no significant injury from fall today and can be transported back to the nursing home.     Arnoldo HookerShari A Upstill, PA-C 05/22/14 1141

## 2014-05-22 NOTE — ED Notes (Signed)
Patient walks very well, on her own.

## 2014-05-23 NOTE — ED Provider Notes (Signed)
Medical screening examination/treatment/procedure(s) were conducted as a shared visit with non-physician practitioner(s) and myself.  I personally evaluated the patient during the encounter.   EKG Interpretation None     Plain films of right wrist negative for fracture  Donnetta HutchingBrian Cook, MD 05/23/14 [phone]

## 2014-06-11 ENCOUNTER — Emergency Department (HOSPITAL_COMMUNITY)
Admission: EM | Admit: 2014-06-11 | Discharge: 2014-06-11 | Disposition: A | Discharge status: Home / Self Care | Payer: Medicare Other | Attending: Emergency Medicine | Admitting: Emergency Medicine

## 2014-06-11 ENCOUNTER — Emergency Department (HOSPITAL_COMMUNITY): Payer: Medicare Other

## 2014-06-11 ENCOUNTER — Encounter (HOSPITAL_COMMUNITY): Payer: Self-pay | Admitting: Emergency Medicine

## 2014-06-11 DIAGNOSIS — S0083XA Contusion of other part of head, initial encounter: Principal | ICD-10-CM | POA: Insufficient documentation

## 2014-06-11 DIAGNOSIS — N390 Urinary tract infection, site not specified: Secondary | ICD-10-CM | POA: Insufficient documentation

## 2014-06-11 DIAGNOSIS — S0003XA Contusion of scalp, initial encounter: Secondary | ICD-10-CM | POA: Diagnosis not present

## 2014-06-11 DIAGNOSIS — F0390 Unspecified dementia without behavioral disturbance: Secondary | ICD-10-CM | POA: Diagnosis not present

## 2014-06-11 DIAGNOSIS — S0093XA Contusion of unspecified part of head, initial encounter: Secondary | ICD-10-CM

## 2014-06-11 DIAGNOSIS — Y9389 Activity, other specified: Secondary | ICD-10-CM | POA: Diagnosis not present

## 2014-06-11 DIAGNOSIS — Y9210 Unspecified residential institution as the place of occurrence of the external cause: Secondary | ICD-10-CM | POA: Insufficient documentation

## 2014-06-11 DIAGNOSIS — W1809XA Striking against other object with subsequent fall, initial encounter: Secondary | ICD-10-CM | POA: Diagnosis not present

## 2014-06-11 DIAGNOSIS — Y9301 Activity, walking, marching and hiking: Secondary | ICD-10-CM | POA: Insufficient documentation

## 2014-06-11 DIAGNOSIS — Z79899 Other long term (current) drug therapy: Secondary | ICD-10-CM | POA: Diagnosis not present

## 2014-06-11 DIAGNOSIS — W19XXXA Unspecified fall, initial encounter: Secondary | ICD-10-CM

## 2014-06-11 DIAGNOSIS — S1093XA Contusion of unspecified part of neck, initial encounter: Principal | ICD-10-CM

## 2014-06-11 DIAGNOSIS — S0990XA Unspecified injury of head, initial encounter: Secondary | ICD-10-CM | POA: Diagnosis present

## 2014-06-11 DIAGNOSIS — Y92129 Unspecified place in nursing home as the place of occurrence of the external cause: Secondary | ICD-10-CM

## 2014-06-11 DIAGNOSIS — Z043 Encounter for examination and observation following other accident: Secondary | ICD-10-CM | POA: Diagnosis present

## 2014-06-11 LAB — CBC WITH DIFFERENTIAL/PLATELET
Basophils Absolute: 0 10*3/uL (ref 0.0–0.1)
Basophils Relative: 0 % (ref 0–1)
EOS PCT: 2 % (ref 0–5)
Eosinophils Absolute: 0.1 10*3/uL (ref 0.0–0.7)
HCT: 44.2 % (ref 36.0–46.0)
Hemoglobin: 14.1 g/dL (ref 12.0–15.0)
LYMPHS ABS: 1.3 10*3/uL (ref 0.7–4.0)
LYMPHS PCT: 25 % (ref 12–46)
MCH: 30.3 pg (ref 26.0–34.0)
MCHC: 31.9 g/dL (ref 30.0–36.0)
MCV: 95.1 fL (ref 78.0–100.0)
Monocytes Absolute: 0.4 10*3/uL (ref 0.1–1.0)
Monocytes Relative: 7 % (ref 3–12)
NEUTROS ABS: 3.3 10*3/uL (ref 1.7–7.7)
NEUTROS PCT: 66 % (ref 43–77)
PLATELETS: 186 10*3/uL (ref 150–400)
RBC: 4.65 MIL/uL (ref 3.87–5.11)
RDW: 13 % (ref 11.5–15.5)
WBC: 5.1 10*3/uL (ref 4.0–10.5)

## 2014-06-11 LAB — URINE MICROSCOPIC-ADD ON

## 2014-06-11 LAB — BASIC METABOLIC PANEL
Anion gap: 14 (ref 5–15)
BUN: 12 mg/dL (ref 6–23)
CHLORIDE: 102 meq/L (ref 96–112)
CO2: 26 meq/L (ref 19–32)
Calcium: 9.2 mg/dL (ref 8.4–10.5)
Creatinine, Ser: 0.78 mg/dL (ref 0.50–1.10)
GFR calc Af Amer: >90 mL/min (ref >90)
GFR calc non Af Amer: 83 mL/min — ABNORMAL LOW (ref >90)
GLUCOSE: 93 mg/dL (ref 70–99)
POTASSIUM: 4.2 meq/L (ref 3.7–5.3)
SODIUM: 142 meq/L (ref 137–147)

## 2014-06-11 LAB — URINALYSIS, ROUTINE W REFLEX MICROSCOPIC
Bilirubin Urine: NEGATIVE
GLUCOSE, UA: NEGATIVE mg/dL
Ketones, ur: 15 mg/dL — AB
NITRITE: NEGATIVE
PH: 5.5 (ref 5.0–8.0)
Protein, ur: NEGATIVE mg/dL
Specific Gravity, Urine: 1.023 (ref 1.005–1.030)
Urobilinogen, UA: 0.2 mg/dL (ref 0.0–1.0)

## 2014-06-11 MED ORDER — RISPERIDONE 0.5 MG PO TABS
0.5000 mg | ORAL_TABLET | Freq: Once | ORAL | Status: AC
  Administered 2014-06-11: 0.5 mg via ORAL
  Filled 2014-06-11: qty 1

## 2014-06-11 MED ORDER — NITROFURANTOIN MONOHYD MACRO 100 MG PO CAPS
100.0000 mg | ORAL_CAPSULE | Freq: Once | ORAL | Status: AC
  Administered 2014-06-11: 100 mg via ORAL
  Filled 2014-06-11: qty 1

## 2014-06-11 MED ORDER — NITROFURANTOIN MONOHYD MACRO 100 MG PO CAPS
100.0000 mg | ORAL_CAPSULE | Freq: Two times a day (BID) | ORAL | Status: DC
Start: 2014-06-11 — End: 2014-09-01

## 2014-06-11 NOTE — ED Notes (Signed)
Pt ambulatory in the room, states that she wants to leave. Sitting with patient at this time.

## 2014-06-11 NOTE — Discharge Instructions (Signed)
Fall precautions. Return if worse, new symptoms, fevers, trouble breathing, change in mental status, other concern.    Fall Prevention and Home Safety Falls cause injuries and can affect all age groups. It is possible to use preventive measures to significantly decrease the likelihood of falls. There are many simple measures which can make your home safer and prevent falls. OUTDOORS  Repair cracks and edges of walkways and driveways.  Remove high doorway thresholds.  Trim shrubbery on the main path into your home.  Have good outside lighting.  Clear walkways of tools, rocks, debris, and clutter.  Check that handrails are not broken and are securely fastened. Both sides of steps should have handrails.  Have leaves, snow, and ice cleared regularly.  Use sand or salt on walkways during winter months.  In the garage, clean up grease or oil spills. BATHROOM  Install night lights.  Install grab bars by the toilet and in the tub and shower.  Use non-skid mats or decals in the tub or shower.  Place a plastic non-slip stool in the shower to sit on, if needed.  Keep floors dry and clean up all water on the floor immediately.  Remove soap buildup in the tub or shower on a regular basis.  Secure bath mats with non-slip, double-sided rug tape.  Remove throw rugs and tripping hazards from the floors. BEDROOMS  Install night lights.  Make sure a bedside light is easy to reach.  Do not use oversized bedding.  Keep a telephone by your bedside.  Have a firm chair with side arms to use for getting dressed.  Remove throw rugs and tripping hazards from the floor. KITCHEN  Keep handles on pots and pans turned toward the center of the stove. Use back burners when possible.  Clean up spills quickly and allow time for drying.  Avoid walking on wet floors.  Avoid hot utensils and knives.  Position shelves so they are not too high or low.  Place commonly used objects within  easy reach.  If necessary, use a sturdy step stool with a grab bar when reaching.  Keep electrical cables out of the way.  Do not use floor polish or wax that makes floors slippery. If you must use wax, use non-skid floor wax.  Remove throw rugs and tripping hazards from the floor. STAIRWAYS  Never leave objects on stairs.  Place handrails on both sides of stairways and use them. Fix any loose handrails. Make sure handrails on both sides of the stairways are as long as the stairs.  Check carpeting to make sure it is firmly attached along stairs. Make repairs to worn or loose carpet promptly.  Avoid placing throw rugs at the top or bottom of stairways, or properly secure the rug with carpet tape to prevent slippage. Get rid of throw rugs, if possible.  Have an electrician put in a light switch at the top and bottom of the stairs. OTHER FALL PREVENTION TIPS  Wear low-heel or rubber-soled shoes that are supportive and fit well. Wear closed toe shoes.  When using a stepladder, make sure it is fully opened and both spreaders are firmly locked. Do not climb a closed stepladder.  Add color or contrast paint or tape to grab bars and handrails in your home. Place contrasting color strips on first and last steps.  Learn and use mobility aids as needed. Install an electrical emergency response system.  Turn on lights to avoid dark areas. Replace light bulbs that burn out  immediately. Get light switches that glow.  Arrange furniture to create clear pathways. Keep furniture in the same place.  Firmly attach carpet with non-skid or double-sided tape.  Eliminate uneven floor surfaces.  Select a carpet pattern that does not visually hide the edge of steps.  Be aware of all pets. OTHER HOME SAFETY TIPS  Set the water temperature for 120 F (48.8 C).  Keep emergency numbers on or near the telephone.  Keep smoke detectors on every level of the home and near sleeping areas. Document  Released: 11/04/2002 Document Revised: 05/15/2012 Document Reviewed: 02/03/2012 [facility] Patient Information 2015 [place], [place]. This information is not intended to replace advice given to you by your health care provider. Make sure you discuss any questions you have with your health care provider.

## 2014-06-11 NOTE — ED Provider Notes (Signed)
CSN: [id number]     Arrival date & time 06/11/14  1637 History   First MD Initiated Contact with Patient 06/11/14 1649     Chief Complaint  Patient presents with  . Fall      HPI  Vision presents after a fall the nursing home tonight. She is that of memory care unit. Seen this morning after she fell forward struck her head against a table on a sitting position. Was at her baseline and was discharged. Apparently was walking away from her afternoon meal this afternoon and fell. She denied loss of consciousness. Per EMS she was awake in route.  Past Medical History  Diagnosis Date  . Dementia    Past Surgical History  Procedure Laterality Date  . Orif wrist fracture Bilateral 02/03/2013    Procedure: OPEN REDUCTION INTERNAL FIXATION (ORIF) RIGHT WRIST FRACTURE,  CLOSED REDUCTION LEFT WRIST;  Surgeon: Johnette AbrahamHarrill C Coley, MD;  Location: [facility] OR;  Service: Plastics;  Laterality: Bilateral;   No family history on file. History  Substance Use Topics  . Smoking status: Never Smoker   . Smokeless tobacco: Never Used  . Alcohol Use: No   OB History   Grav Para Term Preterm Abortions TAB SAB Ect Mult Living                 Review of Systems  Unable to perform ROS: Dementia      Allergies  Tamiflu  Home Medications   Prior to Admission medications   Medication Sig Start Date End Date Taking? Authorizing Provider  acetaminophen (TYLENOL) 500 MG tablet Take 500 mg by mouth every 4 (four) hours as needed for mild pain or fever.   Yes Historical Provider, MD  ALPRAZolam Prudy Feeler(XANAX) 0.5 MG tablet Take 0.5 mg by mouth 2 (two) times daily.   Yes Historical Provider, MD  alum & mag hydroxide-simeth (MAALOX/MYLANTA) 200-200-20 MG/5ML suspension Take 30 mLs by mouth 4 (four) times daily as needed for indigestion or heartburn.   Yes Historical Provider, MD  divalproex (DEPAKOTE ER) 250 MG 24 hr tablet Take 250 mg by mouth 2 (two) times daily.   Yes Historical Provider, MD  donepezil (ARICEPT) 10 MG  tablet Take 10 mg by mouth at bedtime.    Yes Historical Provider, MD  escitalopram (LEXAPRO) 20 MG tablet Take 20 mg by mouth daily.   Yes Historical Provider, MD  guaifenesin (ROBITUSSIN) 100 MG/5ML syrup Take 200 mg by mouth every 6 (six) hours as needed for cough.   Yes Historical Provider, MD  loperamide (IMODIUM) 2 MG capsule Take 2 mg by mouth as needed for diarrhea or loose stools.   Yes Historical Provider, MD  magnesium hydroxide (MILK OF MAGNESIA) 400 MG/5ML suspension Take 30 mLs by mouth at bedtime as needed for mild constipation.   Yes Historical Provider, MD  Memantine HCl ER (NAMENDA XR) 7 MG CP24 Take 7 mg by mouth daily.    Yes Historical Provider, MD  neomycin-bacitracin-polymyxin (NEOSPORIN) ointment Apply 1 application topically as needed for wound care. apply to eye   Yes Historical Provider, MD  nystatin (MYCOSTATIN/NYSTOP) 100000 UNIT/GM POWD Apply 1 g topically 3 (three) times daily as needed (for abdominal areas).   Yes Historical Provider, MD  traMADol (ULTRAM) 50 MG tablet Take 50 mg by mouth every 6 (six) hours as needed (for pain).  02/05/13  Yes Freeman CaldronMichael J. Jeffery, PA-C  nitrofurantoin, macrocrystal-monohydrate, (MACROBID) 100 MG capsule Take 1 capsule (100 mg total) by mouth 2 (two) times daily.  06/11/14   Rolland Porter, MD   BP 113/59  Pulse 64  Temp(Src) 98 F (36.7 C) (Oral)  Resp 18  SpO2 98% Physical Exam  Constitutional: She appears well-developed and well-nourished. No distress.  Awake and alert. No altered level of consciousness. Dementia. Able to answer some very simple questioning only. Oriented only to person. Moving all 4 extremities. No signs of trauma  HENT:  Head: Normocephalic.  Eyes: Conjunctivae are normal. Pupils are equal, round, and reactive to light. No scleral icterus.  Neck: Normal range of motion. Neck supple. No thyromegaly present.  Cardiovascular: Normal rate and regular rhythm.  Exam reveals no gallop and no friction rub.   No murmur  heard. Pulmonary/Chest: Effort normal and breath sounds normal. No respiratory distress. She has no wheezes. She has no rales.  Abdominal: Soft. Bowel sounds are normal. She exhibits no distension. There is no tenderness. There is no rebound.  Musculoskeletal: Normal range of motion.  Neurological: She is alert. She is disoriented.  Skin: Skin is warm and dry. No rash noted.  Psychiatric: She has a normal mood and affect. Her behavior is normal.    ED Course  Procedures (including critical care time) Labs Review Labs Reviewed  BASIC METABOLIC PANEL - Abnormal; Notable for the following:    GFR calc non Af Amer 83 (*)    All other components within normal limits  URINALYSIS, ROUTINE W REFLEX MICROSCOPIC - Abnormal; Notable for the following:    APPearance HAZY (*)    Hgb urine dipstick SMALL (*)    Ketones, ur 15 (*)    Leukocytes, UA MODERATE (*)    All other components within normal limits  URINE MICROSCOPIC-ADD ON - Abnormal; Notable for the following:    Squamous Epithelial / LPF FEW (*)    Bacteria, UA FEW (*)    All other components within normal limits  CBC WITH DIFFERENTIAL    Imaging Review Ct Head Wo Contrast  06/11/2014   CLINICAL DATA:  Fall and struck head.  EXAM: CT HEAD WITHOUT CONTRAST  TECHNIQUE: Contiguous axial images were obtained from the base of the skull through the vertex without intravenous contrast.  COMPARISON:  02/03/2013  FINDINGS: There is no evidence for acute hemorrhage, hydrocephalus, mass lesion, or abnormal extra-axial fluid collection. No definite CT evidence for acute infarction. Diffuse loss of parenchymal volume is consistent with atrophy. Patchy low attenuation in the deep hemispheric and periventricular white matter is nonspecific, but likely reflects chronic microvascular ischemic demyelination. Chronic polypoid mucosal thickening is seen in the left maxillary sinus. Remaining visualized paranasal sinuses and mastoid air cells are clear. No  evidence for skull fracture.  IMPRESSION: Interval resolution of the right subdural hematoma. No acute findings on the current exam.  Atrophy with chronic small vessel white matter ischemic demyelination.   Electronically Signed   By: Kennith Center M.D.   On: 06/11/2014 18:55     EKG Interpretation None      MDM   Final diagnoses:  UTI (lower urinary tract infection)    CT shows no acute changes. No evidence of continuation or recurrence of her previous subdural hematoma. Urine she just infection. Electrolytes are normal. Son was here and had concerns because she has fallen twice today. I see no signs of injury. She has a baseline dementia. I recommended that he requested care conference with her physicians as he has concerned that "they might be overmedicated her". She is awake alert ambulatory here. She is appropriate for  outpatient followup.    Rolland Porter, MD 06/11/14 [phone]

## 2014-06-11 NOTE — Progress Notes (Addendum)
06/11/14 19:56 W. Stann Mainland RN BSN [phone] Pt has a UTI being discharged  on Macrobid 100 mg BID PO. [name] [phone] Spoke with Crystal Davila nurse updated  her about  patient current status and  will be returning to facility tonight via [facility]. Spoke with son regarding the phone to the SNF, voiced appreciation. Updated  Claiborne Billings RN on Pod D.    ED CM consulted by Levonne Spiller on [place] D concerning patient from SNF this is her 2nd ED visit today for recurrent fall. Pt is at [facility] on the [place] [facility]. Met patient and son Crystal Davila [phone] at bedside. Son voiced concerns about care at [facility]. Discussed with son that he can contact the Administrator of the SNF and  direct  His concerns. Son verbalized understanding. Son concerned that mom may need to be closely monitored once she returned to facility. Offered to contact facility to notify them once ED eval is completed, son is agreeable and verbalized appreciation.

## 2014-06-11 NOTE — ED Provider Notes (Signed)
CSN: [id number]     Arrival date & time 06/11/14  0940 History   First MD Initiated Contact with Patient 06/11/14 [phone]     Chief Complaint  Patient presents with  . Fall     (Consider location/radiation/quality/duration/timing/severity/associated sxs/prior Treatment) Patient is a 68 y.o. female presenting with fall. The history is provided by the patient and the EMS personnel. The history is limited by the condition of the patient.  Fall Pertinent negatives include no chest pain, no headaches and no shortness of breath.  pt  w hx dementia, from ecf via ems, s/p fall. Pt was at table, fell asleep. Head fell forward hitting table. No fall to ground. pts mental status has remained at baseline post fall. Pt denies any pain. No nv.  Limited history given dementia/confusion - level 5 caveat.        Past Medical History  Diagnosis Date  . Dementia    Past Surgical History  Procedure Laterality Date  . Orif wrist fracture Bilateral 02/03/2013    Procedure: OPEN REDUCTION INTERNAL FIXATION (ORIF) RIGHT WRIST FRACTURE,  CLOSED REDUCTION LEFT WRIST;  Surgeon: Johnette AbrahamHarrill C Coley, MD;  Location: [facility] OR;  Service: Plastics;  Laterality: Bilateral;   No family history on file. History  Substance Use Topics  . Smoking status: Never Smoker   . Smokeless tobacco: Never Used  . Alcohol Use: No   OB History   Grav Para Term Preterm Abortions TAB SAB Ect Mult Living                 Review of Systems  Unable to perform ROS: Dementia  Respiratory: Negative for shortness of breath.   Cardiovascular: Negative for chest pain.  Musculoskeletal: Negative for back pain and neck pain.  Neurological: Negative for headaches.  level 5 caveat - dementia/confusion at baseline      Allergies  Tamiflu  Home Medications   Prior to Admission medications   Medication Sig Start Date End Date Taking? Authorizing Provider  acetaminophen (TYLENOL) 500 MG tablet Take 500 mg by mouth every 4 (four) hours as  needed for mild pain or fever.   Yes Historical Provider, MD  ALPRAZolam Prudy Feeler(XANAX) 0.5 MG tablet Take 0.5 mg by mouth 2 (two) times daily.   Yes Historical Provider, MD  alum & mag hydroxide-simeth (MAALOX/MYLANTA) 200-200-20 MG/5ML suspension Take 30 mLs by mouth 4 (four) times daily as needed for indigestion or heartburn.   Yes Historical Provider, MD  divalproex (DEPAKOTE ER) 250 MG 24 hr tablet Take 250 mg by mouth 2 (two) times daily.   Yes Historical Provider, MD  donepezil (ARICEPT) 10 MG tablet Take 10 mg by mouth at bedtime.    Yes Historical Provider, MD  escitalopram (LEXAPRO) 20 MG tablet Take 20 mg by mouth daily.   Yes Historical Provider, MD  guaifenesin (ROBITUSSIN) 100 MG/5ML syrup Take 200 mg by mouth every 6 (six) hours as needed for cough.   Yes Historical Provider, MD  loperamide (IMODIUM) 2 MG capsule Take 2 mg by mouth as needed for diarrhea or loose stools.   Yes Historical Provider, MD  magnesium hydroxide (MILK OF MAGNESIA) 400 MG/5ML suspension Take 30 mLs by mouth at bedtime as needed for mild constipation.   Yes Historical Provider, MD  Memantine HCl ER (NAMENDA XR) 7 MG CP24 Take 7 mg by mouth daily.    Yes Historical Provider, MD  neomycin-bacitracin-polymyxin (NEOSPORIN) ointment Apply 1 application topically as needed for wound care. apply to eye  Yes Historical Provider, MD  nystatin (MYCOSTATIN/NYSTOP) 100000 UNIT/GM POWD Apply 1 g topically 3 (three) times daily as needed (for abdominal areas).   Yes Historical Provider, MD  traMADol (ULTRAM) 50 MG tablet Take 50 mg by mouth every 6 (six) hours as needed (for pain).  02/05/13  Yes Freeman Caldron, PA-C   BP 122/79  Pulse 62  Temp(Src) 97.7 F (36.5 C) (Oral)  Resp 13  SpO2 98% Physical Exam  Nursing note and vitals reviewed. Constitutional: She appears well-developed and well-nourished. No distress.  HENT:  Head: Atraumatic.  No head or scalp contusions or tenderness  Eyes: Conjunctivae and EOM are  normal. Pupils are equal, round, and reactive to light. No scleral icterus.  Neck: Normal range of motion. Neck supple. No tracheal deviation present.  Cardiovascular: Normal rate, regular rhythm, normal heart sounds and intact distal pulses.   Pulmonary/Chest: Effort normal and breath sounds normal. No respiratory distress. She exhibits no tenderness.  Abdominal: Soft. Normal appearance and bowel sounds are normal. She exhibits no distension. There is no tenderness.  Musculoskeletal: She exhibits no edema and no tenderness.  Neurological: She is alert.  Awake and alert, pleasant, confused - mental status at baseline per report. Moves bil extremities purposefully w normal strength, sensation intact.   Skin: Skin is warm and dry. No rash noted. She is not diaphoretic.  Psychiatric: She has a normal mood and affect.    ED Course  Procedures (including critical care time)   EKG Interpretation   Date/Time:  Wednesday June 11 2014 09:53:09 EDT Ventricular Rate:  59 PR Interval:  143 QRS Duration: 85 QT Interval:  454 QTC Calculation: 450 R Axis:   49 Text Interpretation:  Sinus rhythm No previous tracing Confirmed by Denton Lank   MD, Caryn Bee ([id number]) on 06/11/2014 10:07:13 AM      MDM  Reviewed nursing notes and prior charts for additional history.   Pt alert, content, nad.  Spine nt.    No pain.  Mental status c/w baseline.  Pt appears stable for d/c.     Suzi Roots, MD 06/11/14 1011

## 2014-06-11 NOTE — ED Notes (Signed)
Patient transported back to NH via [facility] with all personal belongings and prescription for antibiotics.

## 2014-06-11 NOTE — Discharge Instructions (Signed)

## 2014-06-11 NOTE — ED Notes (Signed)
Pt wondering around hallway with steady gait. Assisted back to exam room.  Dr.James at bedside.

## 2014-06-11 NOTE — ED Notes (Signed)
Pt taken to CT.

## 2014-06-11 NOTE — ED Notes (Signed)
Did in and out cath on patient cloudy urine in return assisted by Nurse and nurse tech

## 2014-06-11 NOTE — ED Notes (Signed)
Case Management and EDP at the bedside

## 2014-06-11 NOTE — ED Notes (Addendum)
68 yo [facility] fell asleep at the dining room table and fell from chair hitting forehead. Alert and at baseline. No hx of blood thinners.  cbg 71

## 2014-06-11 NOTE — ED Notes (Signed)
Son arrived, states that he would like to talk with MD and Case Management about pt situation. Team informed and plan to talk with family.

## 2014-06-11 NOTE — ED Notes (Signed)
[facility] after sliding from chair to floor.  [facility] was "[facility]." Hx of dementia, denies pain, alert to self only.  Recently d/c from ED this morning for same.

## 2014-09-01 ENCOUNTER — Encounter (HOSPITAL_COMMUNITY): Payer: Self-pay | Admitting: Emergency Medicine

## 2014-09-01 ENCOUNTER — Emergency Department (HOSPITAL_COMMUNITY)
Admission: EM | Admit: 2014-09-01 | Discharge: 2014-09-01 | Disposition: A | Discharge status: Home / Self Care | Payer: Medicare Other | Attending: Emergency Medicine | Admitting: Emergency Medicine

## 2014-09-01 DIAGNOSIS — Z79899 Other long term (current) drug therapy: Secondary | ICD-10-CM | POA: Insufficient documentation

## 2014-09-01 DIAGNOSIS — N390 Urinary tract infection, site not specified: Secondary | ICD-10-CM | POA: Insufficient documentation

## 2014-09-01 DIAGNOSIS — Y92199 Unspecified place in other specified residential institution as the place of occurrence of the external cause: Secondary | ICD-10-CM | POA: Insufficient documentation

## 2014-09-01 DIAGNOSIS — S3992XA Unspecified injury of lower back, initial encounter: Secondary | ICD-10-CM | POA: Diagnosis present

## 2014-09-01 DIAGNOSIS — Y9389 Activity, other specified: Secondary | ICD-10-CM | POA: Insufficient documentation

## 2014-09-01 DIAGNOSIS — F0390 Unspecified dementia without behavioral disturbance: Secondary | ICD-10-CM | POA: Diagnosis not present

## 2014-09-01 DIAGNOSIS — Z8659 Personal history of other mental and behavioral disorders: Secondary | ICD-10-CM

## 2014-09-01 DIAGNOSIS — W07XXXA Fall from chair, initial encounter: Secondary | ICD-10-CM | POA: Insufficient documentation

## 2014-09-01 LAB — URINALYSIS, ROUTINE W REFLEX MICROSCOPIC
BILIRUBIN URINE: NEGATIVE
GLUCOSE, UA: NEGATIVE mg/dL
Ketones, ur: 40 mg/dL — AB
Nitrite: NEGATIVE
Protein, ur: NEGATIVE mg/dL
Specific Gravity, Urine: 1.018 (ref 1.005–1.030)
Urobilinogen, UA: 1 mg/dL (ref 0.0–1.0)
pH: 6 (ref 5.0–8.0)

## 2014-09-01 LAB — COMPREHENSIVE METABOLIC PANEL
ALBUMIN: 3.2 g/dL — AB (ref 3.5–5.2)
ALK PHOS: 33 U/L — AB (ref 39–117)
ALT: 12 U/L (ref 0–35)
ANION GAP: 13 (ref 5–15)
AST: 17 U/L (ref 0–37)
BILIRUBIN TOTAL: 0.5 mg/dL (ref 0.3–1.2)
BUN: 11 mg/dL (ref 6–23)
CHLORIDE: 94 meq/L — AB (ref 96–112)
CO2: 25 mEq/L (ref 19–32)
Calcium: 9 mg/dL (ref 8.4–10.5)
Creatinine, Ser: 0.52 mg/dL (ref 0.50–1.10)
GFR calc Af Amer: >90 mL/min (ref >90)
GFR calc non Af Amer: >90 mL/min (ref >90)
Glucose, Bld: 124 mg/dL — ABNORMAL HIGH (ref 70–99)
POTASSIUM: 3.9 meq/L (ref 3.7–5.3)
SODIUM: 132 meq/L — AB (ref 137–147)
Total Protein: 7.5 g/dL (ref 6.0–8.3)

## 2014-09-01 LAB — LIPASE, BLOOD: Lipase: 21 U/L (ref 11–59)

## 2014-09-01 LAB — URINE MICROSCOPIC-ADD ON

## 2014-09-01 LAB — CBC WITH DIFFERENTIAL/PLATELET
BASOS PCT: 0 % (ref 0–1)
Basophils Absolute: 0 10*3/uL (ref 0.0–0.1)
Eosinophils Absolute: 0 10*3/uL (ref 0.0–0.7)
Eosinophils Relative: 0 % (ref 0–5)
HCT: 47.1 % — ABNORMAL HIGH (ref 36.0–46.0)
Hemoglobin: 15.7 g/dL — ABNORMAL HIGH (ref 12.0–15.0)
Lymphocytes Relative: 11 % — ABNORMAL LOW (ref 12–46)
Lymphs Abs: 1.1 10*3/uL (ref 0.7–4.0)
MCH: 30.9 pg (ref 26.0–34.0)
MCHC: 33.3 g/dL (ref 30.0–36.0)
MCV: 92.7 fL (ref 78.0–100.0)
Monocytes Absolute: 1 10*3/uL (ref 0.1–1.0)
Monocytes Relative: 10 % (ref 3–12)
NEUTROS ABS: 8 10*3/uL — AB (ref 1.7–7.7)
NEUTROS PCT: 79 % — AB (ref 43–77)
Platelets: 178 10*3/uL (ref 150–400)
RBC: 5.08 MIL/uL (ref 3.87–5.11)
RDW: 12.4 % (ref 11.5–15.5)
WBC: 10.2 10*3/uL (ref 4.0–10.5)

## 2014-09-01 MED ORDER — CEPHALEXIN 500 MG PO CAPS: 500.0000 mg | ORAL_CAPSULE | Freq: Two times a day (BID) | ORAL | Status: DC

## 2014-09-01 NOTE — Discharge Instructions (Signed)
Call for a follow up appointment with a Family or Primary Care Provider.  °Return if Symptoms worsen.   °Take medication as prescribed.  ° °

## 2014-09-01 NOTE — ED Notes (Signed)
Pt's son at bedside requesting to speak with ED provider, sts he is having concerns about pt's safety at [facility] and would like to talk to someone about placement to different facility/nursing home.

## 2014-09-01 NOTE — Progress Notes (Signed)
  CARE MANAGEMENT ED NOTE 09/01/2014  Patient:  Crystal Davila,Crystal Davila   Account Number:  [id number]  Date Initiated:  09/01/2014  Documentation initiated by:  Radford PaxFERRERO,Shaw Dobek  Subjective/Objective Assessment:   Patient presents to Ed post fall.     Subjective/Objective Assessment Detail:   [facility]     Action/Plan:   Action/Plan Detail:   Anticipated DC Date:  09/01/2014     Status Recommendation to Physician:   Result of Recommendation:     In-house referral  Clinical Social Worker   DC Planning Services  CM consult  Other    Choice offered to / List presented to:            Status of service:  Completed, signed off  ED Comments:   ED Comments Detail:  EDCM spoke to EDRN who reports patient's son is at bedside questioning [facility]'s safety [facility] and possibly would want higher level of care.  EDCM provided EDRN with EDSW at [facility].  No further EDCM needs at this time.

## 2014-09-01 NOTE — ED Notes (Signed)
[facility] called for transport, attempted to call report to [facility], no answer.

## 2014-09-01 NOTE — Progress Notes (Signed)
09/01/2014 A. Crystal Davila RNCM 1822pm EDSW unavailabel at this time.  EDCM spoke to patient's son at bedside.  Patient's son Crystal Davila reports he is the POA.  Crystal Davila is concerned for [facility]'s safety at [facility].  Crystal Davila is considering moving her to a higher level of care or different facility.  Crystal Davila reports he has already spoken with staff associated with [facility].  EDCM discussed patient with EDPA.  Orders placed for patient to receive PT, OT at the facility.  EDCM spoke to [place] CNA at [facility]. EDCM asked Alvy BealLakisha if the facility will be able to facilitate PT,OT if orders are sent with patient?  Alvy BealLakisha reported, "Yes we can."  EDCM informed Alvy BealLakisha that patient's son Crystal Davila is concerned that patient is having frequent falls and is requesting that patient is looked in on more often.   Alvy BealLakisha reports she will inform Med Tech of his request.  EDCM asked Alvy BealLakisha who would patient's son need to speak too to address higher level of care or transfer to another facility?  Alvy BealLakisha reported that Crystal Davila should call [phone] and ask to speak to [place] the DON.  Alvy BealLakisha did not have a direct number for [place].    EDCM provided this infomation to patient's son Crystal Davila.  As per Crystal Davila, "They tell me she gets around better than most of the people in there, so I don't understand the falls."  [facility]'s [facility].  No further EDCM needs at this time.

## 2014-09-01 NOTE — ED Notes (Signed)
[facility] here to transport pt back to [facility]. 

## 2014-09-01 NOTE — ED Notes (Signed)
[facility] EMS [facility]/o bilateral hip and flank pain, s/p fall this morning

## 2014-09-01 NOTE — ED Provider Notes (Signed)
CSN: [id number]     Arrival date & time 09/01/14  1328 History   None    Chief Complaint  Patient presents with  . Back Pain  . Hip Pain     (Consider location/radiation/quality/duration/timing/severity/associated sxs/prior Treatment) HPI Comments: The patient is a 68 year old female, current resident of [facility], with a past medical history of dementia, presenting to the emergency room chief complaint of increase in tiredness throughout the day.  Per facility worker patient has had increase in tiredness during the day, awakened tonight. Reports today the patient "slid out of her chair", denies blow to head, LOC. Was able to ambulate after fall. Ambulates without assistance at baseline. Patient denies knee pain, hip pain. Reports abdominal discomfort. Level V caveat due to history of dementia  The history is provided by the patient, the EMS personnel and the nursing home. No language interpreter was used.    Past Medical History  Diagnosis Date  . Dementia    Past Surgical History  Procedure Laterality Date  . Orif wrist fracture Bilateral 02/03/2013    Procedure: OPEN REDUCTION INTERNAL FIXATION (ORIF) RIGHT WRIST FRACTURE,  CLOSED REDUCTION LEFT WRIST;  Surgeon: Johnette AbrahamHarrill C Coley, MD;  Location: [facility] OR;  Service: Plastics;  Laterality: Bilateral;   No family history on file. History  Substance Use Topics  . Smoking status: Never Smoker   . Smokeless tobacco: Never Used  . Alcohol Use: No   OB History   Grav Para Term Preterm Abortions TAB SAB Ect Mult Living                 Review of Systems  Unable to perform ROS     Allergies  Tamiflu  Home Medications   Prior to Admission medications   Medication Sig Start Date End Date Taking? Authorizing Provider  acetaminophen (TYLENOL) 500 MG tablet Take 500 mg by mouth every 4 (four) hours as needed for mild pain or fever.   Yes Historical Provider, MD  alum & mag hydroxide-simeth (MAALOX/MYLANTA) 200-200-20 MG/5ML  suspension Take 30 mLs by mouth 4 (four) times daily as needed for indigestion or heartburn.   Yes Historical Provider, MD  divalproex (DEPAKOTE ER) 250 MG 24 hr tablet Take 250 mg by mouth 2 (two) times daily.   Yes Historical Provider, MD  donepezil (ARICEPT) 10 MG tablet Take 10 mg by mouth at bedtime.    Yes Historical Provider, MD  escitalopram (LEXAPRO) 20 MG tablet Take 20 mg by mouth daily.   Yes Historical Provider, MD  guaifenesin (ROBITUSSIN) 100 MG/5ML syrup Take 200 mg by mouth every 6 (six) hours as needed for cough.   Yes Historical Provider, MD  loperamide (IMODIUM) 2 MG capsule Take 2 mg by mouth as needed for diarrhea or loose stools.   Yes Historical Provider, MD  magnesium hydroxide (MILK OF MAGNESIA) 400 MG/5ML suspension Take 30 mLs by mouth at bedtime as needed for mild constipation.   Yes Historical Provider, MD  Memantine HCl ER (NAMENDA XR) 7 MG CP24 Take 7 mg by mouth daily.    Yes Historical Provider, MD  neomycin-bacitracin-polymyxin (NEOSPORIN) ointment Apply 1 application topically as needed for wound care. apply to eye   Yes Historical Provider, MD  nystatin (MYCOSTATIN/NYSTOP) 100000 UNIT/GM POWD Apply 1 g topically 3 (three) times daily as needed (for abdominal areas).   Yes Historical Provider, MD  temazepam (RESTORIL) 7.5 MG capsule Take 7.5 mg by mouth at bedtime as needed for sleep.   Yes Historical Provider,  MD  traMADol (ULTRAM) 50 MG tablet Take 50 mg by mouth every 6 (six) hours as needed (for pain).  02/05/13  Yes Freeman Caldron, PA-C   BP 114/85  Pulse 79  Temp(Src) 98.1 F (36.7 C) (Oral)  Resp 18  SpO2 97% Physical Exam  Nursing note and vitals reviewed. Constitutional: She appears well-developed and well-nourished. No distress.  HENT:  Head: Normocephalic and atraumatic.  Mouth/Throat: Oropharynx is clear and moist.  Eyes: EOM are normal. Pupils are equal, round, and reactive to light.  Neck: Neck supple.  Cardiovascular: Normal rate and  regular rhythm.   Pulmonary/Chest: Effort normal and breath sounds normal. No respiratory distress. She has no wheezes. She has no rales.  Abdominal: Soft.  Musculoskeletal:       Right hip: She exhibits normal range of motion and no tenderness.       Left hip: She exhibits normal range of motion and no tenderness.       Right knee: No tenderness found.       Left knee: No tenderness found.  Bilateral hips, passive internal external rotation without discomfort.  Neurological: She is alert. She is disoriented. GCS eye subscore is 4. GCS verbal subscore is 5. GCS motor subscore is 6.  Disoriented to date.   Speech is clear, but confused.  Talking about topics and things that do not have to do with exam. II-Visual fields were normal.   III/IV/VI-Pupils were equal and reacted. Extraocular movements were full and conjugate.  V/VII-no facial droop.   VIII-normal.   Motor: Strength 5/5 to upper and lower extremities bilaterally. Moves all 4 extremities equally. Sensory: normal sensation to upper and lower extremities.  Cerebellar: Normal finger to nose bilaterally, however the patient use providers thumb as nose on multiple occurences.  Skin: Skin is warm and dry. She is not diaphoretic.  Psychiatric: She has a normal mood and affect. Her behavior is normal.    ED Course  Procedures (including critical care time) Labs Review Results for orders placed during the hospital encounter of 09/01/14  CBC WITH DIFFERENTIAL      Result Value Ref Range   WBC 10.2  4.0 - 10.5 K/uL   RBC 5.08  3.87 - 5.11 MIL/uL   Hemoglobin 15.7 (*) 12.0 - 15.0 g/dL   HCT 40.9 (*) 81.1 - 91.4 %   MCV 92.7  78.0 - 100.0 fL   MCH 30.9  26.0 - 34.0 pg   MCHC 33.3  30.0 - 36.0 g/dL   RDW 78.2  95.6 - 21.3 %   Platelets 178  150 - 400 K/uL   Neutrophils Relative % 79 (*) 43 - 77 %   Neutro Abs 8.0 (*) 1.7 - 7.7 K/uL   Lymphocytes Relative 11 (*) 12 - 46 %   Lymphs Abs 1.1  0.7 - 4.0 K/uL   Monocytes Relative 10  3  - 12 %   Monocytes Absolute 1.0  0.1 - 1.0 K/uL   Eosinophils Relative 0  0 - 5 %   Eosinophils Absolute 0.0  0.0 - 0.7 K/uL   Basophils Relative 0  0 - 1 %   Basophils Absolute 0.0  0.0 - 0.1 K/uL  COMPREHENSIVE METABOLIC PANEL      Result Value Ref Range   Sodium 132 (*) 137 - 147 mEq/L   Potassium 3.9  3.7 - 5.3 mEq/L   Chloride 94 (*) 96 - 112 mEq/L   CO2 25  19 - 32 mEq/L  Glucose, Bld 124 (*) 70 - 99 mg/dL   BUN 11  6 - 23 mg/dL   Creatinine, Ser 1.19  0.50 - 1.10 mg/dL   Calcium 9.0  8.4 - 14.7 mg/dL   Total Protein 7.5  6.0 - 8.3 g/dL   Albumin 3.2 (*) 3.5 - 5.2 g/dL   AST 17  0 - 37 U/L   ALT 12  0 - 35 U/L   Alkaline Phosphatase 33 (*) 39 - 117 U/L   Total Bilirubin 0.5  0.3 - 1.2 mg/dL   GFR calc non Af Amer >90  >90 mL/min   GFR calc Af Amer >90  >90 mL/min   Anion gap 13  5 - 15  LIPASE, BLOOD      Result Value Ref Range   Lipase 21  11 - 59 U/L  URINALYSIS, ROUTINE W REFLEX MICROSCOPIC      Result Value Ref Range   Color, Urine AMBER (*) YELLOW   APPearance CLEAR  CLEAR   Specific Gravity, Urine 1.018  1.005 - 1.030   pH 6.0  5.0 - 8.0   Glucose, UA NEGATIVE  NEGATIVE mg/dL   Hgb urine dipstick TRACE (*) NEGATIVE   Bilirubin Urine NEGATIVE  NEGATIVE   Ketones, ur 40 (*) NEGATIVE mg/dL   Protein, ur NEGATIVE  NEGATIVE mg/dL   Urobilinogen, UA 1.0  0.0 - 1.0 mg/dL   Nitrite NEGATIVE  NEGATIVE   Leukocytes, UA TRACE (*) NEGATIVE  URINE MICROSCOPIC-ADD ON      Result Value Ref Range   Squamous Epithelial / LPF FEW (*) RARE   WBC, UA 0-2  <3 WBC/hpf   RBC / HPF 3-6  <3 RBC/hpf   Bacteria, UA MANY (*) RARE   Urine-Other MUCOUS PRESENT     No results found.  Imaging Review No results found.   EKG Interpretation None      MDM   Final diagnoses:  UTI (lower urinary tract infection)  History of dementia   Patient presents from her house with fall, bilateral hips, knees without discomfort with active and passive range of motion no obvious  deformity, labs ordered. 3:25 PM Discussed Doris Cheadle with at [facility] would not wake up today, slid of a chair no LOC. Patient was able to ambulate without assistance after fall. UA shows infection, will treat culture sent. Upon discharge the patient's son is requesting patient evaluated for a skilled nursing facility for steady decline over the last 2 months. And multiple falls. Discussed with case management, social work unavailable PT/OT orders placed.  Meds given in ED:  Medications - No data to display  New Prescriptions   CEPHALEXIN (KEFLEX) 500 MG CAPSULE    Take 1 capsule (500 mg total) by mouth 2 (two) times daily.    Mellody Drown, PA-C 09/02/14 [phone]

## 2014-09-01 NOTE — ED Provider Notes (Addendum)
Medical screening examination/treatment/procedure(s) were conducted as a shared visit with non-physician practitioner(s) and myself.  I personally evaluated the patient during the encounter.   EKG Interpretation None       Briefly, pt is a 68 y.o. female presenting with generalized weakness.  She slid out of her chair today so was sent for eval.  No hit to head or concussive symptoms.  I performed an examination on the patient including cardiac, pulmonary, and gi systems which were remarkable for generalized abd tenderness, however exam is extremely limited secondary to dementia and pt states her abd is tender because she had papers sitting on it and had to jump up and down.  Labwork as above unremarkable.  UA with leuks, will treat with abx.  DC home in stable condition.   Mirian MoMatthew Gentry, MD 09/01/14 [phone]

## 2014-09-02 ENCOUNTER — Emergency Department (HOSPITAL_COMMUNITY)
Admission: EM | Admit: 2014-09-02 | Discharge: 2014-09-02 | Disposition: A | Discharge status: Home / Self Care | Payer: Medicare Other | Attending: Emergency Medicine | Admitting: Emergency Medicine

## 2014-09-02 ENCOUNTER — Emergency Department (HOSPITAL_COMMUNITY): Payer: Medicare Other

## 2014-09-02 ENCOUNTER — Encounter (HOSPITAL_COMMUNITY): Payer: Self-pay | Admitting: Emergency Medicine

## 2014-09-02 DIAGNOSIS — F0390 Unspecified dementia without behavioral disturbance: Secondary | ICD-10-CM | POA: Diagnosis not present

## 2014-09-02 DIAGNOSIS — Z79899 Other long term (current) drug therapy: Secondary | ICD-10-CM | POA: Insufficient documentation

## 2014-09-02 DIAGNOSIS — W010XXD Fall on same level from slipping, tripping and stumbling without subsequent striking against object, subsequent encounter: Secondary | ICD-10-CM | POA: Insufficient documentation

## 2014-09-02 DIAGNOSIS — W19XXXD Unspecified fall, subsequent encounter: Secondary | ICD-10-CM

## 2014-09-02 DIAGNOSIS — Z792 Long term (current) use of antibiotics: Secondary | ICD-10-CM | POA: Insufficient documentation

## 2014-09-02 DIAGNOSIS — S3992XD Unspecified injury of lower back, subsequent encounter: Secondary | ICD-10-CM | POA: Diagnosis present

## 2014-09-02 DIAGNOSIS — Z87440 Personal history of urinary (tract) infections: Secondary | ICD-10-CM | POA: Diagnosis not present

## 2014-09-02 DIAGNOSIS — Y92129 Unspecified place in nursing home as the place of occurrence of the external cause: Secondary | ICD-10-CM

## 2014-09-02 NOTE — ED Provider Notes (Signed)
CSN: [id number]     Arrival date & time 09/02/14  0026 History   First MD Initiated Contact with Patient 09/02/14 0044     Chief Complaint  Patient presents with  . Fall     (Consider location/radiation/quality/duration/timing/severity/associated sxs/prior Treatment) HPI 68 year old female presents to the emergency room and from her nursing facility after being found in the floor next her bed.  Patient was seen earlier in the day for an unwitnessed fall and was discovered to have a urinary tract infection.  Son who has POA is very frustrated with the nursing facility, does not feel that they are keeping a close eye on his mother.  Workup today other than urinary tract infection was unremarkable.  Patient without complaints.  She does have significant dementia. Past Medical History  Diagnosis Date  . Dementia    Past Surgical History  Procedure Laterality Date  . Orif wrist fracture Bilateral 02/03/2013    Procedure: OPEN REDUCTION INTERNAL FIXATION (ORIF) RIGHT WRIST FRACTURE,  CLOSED REDUCTION LEFT WRIST;  Surgeon: Johnette Abraham, MD;  Location: [facility] OR;  Service: Plastics;  Laterality: Bilateral;   History reviewed. No pertinent family history. History  Substance Use Topics  . Smoking status: Never Smoker   . Smokeless tobacco: Never Used  . Alcohol Use: No   OB History   Grav Para Term Preterm Abortions TAB SAB Ect Mult Living                 Review of Systems  Unable to perform ROS: Dementia      Allergies  Tamiflu  Home Medications   Prior to Admission medications   Medication Sig Start Date End Date Taking? Authorizing Provider  acetaminophen (TYLENOL) 500 MG tablet Take 500 mg by mouth every 4 (four) hours as needed for mild pain or fever.   Yes Historical Provider, MD  alum & mag hydroxide-simeth (MAALOX/MYLANTA) 200-200-20 MG/5ML suspension Take 30 mLs by mouth 4 (four) times daily as needed for indigestion or heartburn.   Yes Historical Provider, MD  cephALEXin  (KEFLEX) 500 MG capsule Take 1 capsule (500 mg total) by mouth 2 (two) times daily. 09/01/14  Yes Mellody Drown, PA-C  divalproex (DEPAKOTE ER) 250 MG 24 hr tablet Take 250 mg by mouth 2 (two) times daily.   Yes Historical Provider, MD  donepezil (ARICEPT) 10 MG tablet Take 10 mg by mouth at bedtime.    Yes Historical Provider, MD  escitalopram (LEXAPRO) 20 MG tablet Take 20 mg by mouth daily.   Yes Historical Provider, MD  guaifenesin (ROBITUSSIN) 100 MG/5ML syrup Take 200 mg by mouth every 6 (six) hours as needed for cough.   Yes Historical Provider, MD  loperamide (IMODIUM) 2 MG capsule Take 2 mg by mouth as needed for diarrhea or loose stools.   Yes Historical Provider, MD  magnesium hydroxide (MILK OF MAGNESIA) 400 MG/5ML suspension Take 30 mLs by mouth at bedtime as needed for mild constipation.   Yes Historical Provider, MD  Memantine HCl ER (NAMENDA XR) 7 MG CP24 Take 7 mg by mouth daily.    Yes Historical Provider, MD  neomycin-bacitracin-polymyxin (NEOSPORIN) ointment Apply 1 application topically as needed for wound care. apply to eye   Yes Historical Provider, MD  nystatin (MYCOSTATIN/NYSTOP) 100000 UNIT/GM POWD Apply 1 g topically 3 (three) times daily as needed (for abdominal areas).   Yes Historical Provider, MD  temazepam (RESTORIL) 7.5 MG capsule Take 7.5 mg by mouth at bedtime as needed for sleep.  Yes Historical Provider, MD  traMADol (ULTRAM) 50 MG tablet Take 50 mg by mouth every 6 (six) hours as needed (for pain).  02/05/13  Yes Freeman CaldronMichael J Jeffery, PA-C   BP 170/82  Pulse 89  Temp(Src) 97.8 F (36.6 C) (Axillary)  Resp 15  SpO2 98% Physical Exam  Nursing note and vitals reviewed. Constitutional: She appears well-developed and well-nourished.  HENT:  Head: Normocephalic and atraumatic.  Right Ear: External ear normal.  Left Ear: External ear normal.  Nose: Nose normal.  Mouth/Throat: Oropharynx is clear and moist.  Patient without tenderness to palpation of head, no  contusion or injury noted.  Eyes: Conjunctivae and EOM are normal. Pupils are equal, round, and reactive to light.  Neck: Normal range of motion. Neck supple. No JVD present. No tracheal deviation present. No thyromegaly present.  Patient has normal range of motion, no neck pain with palpation  Cardiovascular: Normal rate, regular rhythm, normal heart sounds and intact distal pulses.  Exam reveals no gallop and no friction rub.   No murmur heard. Pulmonary/Chest: Effort normal and breath sounds normal. No stridor. No respiratory distress. She has no wheezes. She has no rales. She exhibits no tenderness.  Abdominal: Soft. Bowel sounds are normal. She exhibits no distension and no mass. There is no tenderness. There is no rebound and no guarding.  Musculoskeletal: Normal range of motion. She exhibits tenderness (patient has mild lower right paraspinal pain with palpation no step-off or crepitus noted). She exhibits no edema.  Lymphadenopathy:    She has no cervical adenopathy.  Neurological: She is alert. She displays normal reflexes. No cranial nerve deficit. She exhibits normal muscle tone. Coordination normal.  Oriented to self only  Skin: Skin is warm and dry. No rash noted. No erythema. No pallor.  Psychiatric: She has a normal mood and affect. Her behavior is normal. Judgment and thought content normal.    ED Course  Procedures (including critical care time) Labs Review Labs Reviewed - No data to display  Imaging Review Dg Lumbar Spine 2-3 Views  09/02/2014   CLINICAL DATA:  Unwitnessed fall while at nursing home last night. Patient has current history of dementia and cannot describe fall in further detail. Lower back pain, acute onset. Initial encounter.  EXAM: LUMBAR SPINE - 2-3 VIEW  COMPARISON:  None.  FINDINGS: There is approximately 25% loss of height noted at vertebral body L1. This is of indeterminate age, without evidence of significant retropulsion. There is mild chronic grade 1  anterolisthesis of L4 on L5. Mild disc space narrowing is noted at L2-L3.  The visualized bowel gas pattern is unremarkable in appearance; air and stool are noted within the colon. The sacroiliac joints are within normal limits.  IMPRESSION: Approximately 25% loss of height noted at vertebral body L1. This is of indeterminate age, without evidence of significant retropulsion.   Electronically Signed   By: Roanna RaiderJeffery  Chang M.D.   On: 09/02/2014 03:09     EKG Interpretation None     Results for orders placed during the hospital encounter of 09/01/14  CBC WITH DIFFERENTIAL      Result Value Ref Range   WBC 10.2  4.0 - 10.5 K/uL   RBC 5.08  3.87 - 5.11 MIL/uL   Hemoglobin 15.7 (*) 12.0 - 15.0 g/dL   HCT 95.647.1 (*) 21.336.0 - 08.646.0 %   MCV 92.7  78.0 - 100.0 fL   MCH 30.9  26.0 - 34.0 pg   MCHC 33.3  30.0 -  36.0 g/dL   RDW 45.4  09.8 - 11.9 %   Platelets 178  150 - 400 K/uL   Neutrophils Relative % 79 (*) 43 - 77 %   Neutro Abs 8.0 (*) 1.7 - 7.7 K/uL   Lymphocytes Relative 11 (*) 12 - 46 %   Lymphs Abs 1.1  0.7 - 4.0 K/uL   Monocytes Relative 10  3 - 12 %   Monocytes Absolute 1.0  0.1 - 1.0 K/uL   Eosinophils Relative 0  0 - 5 %   Eosinophils Absolute 0.0  0.0 - 0.7 K/uL   Basophils Relative 0  0 - 1 %   Basophils Absolute 0.0  0.0 - 0.1 K/uL  COMPREHENSIVE METABOLIC PANEL      Result Value Ref Range   Sodium 132 (*) 137 - 147 mEq/L   Potassium 3.9  3.7 - 5.3 mEq/L   Chloride 94 (*) 96 - 112 mEq/L   CO2 25  19 - 32 mEq/L   Glucose, Bld 124 (*) 70 - 99 mg/dL   BUN 11  6 - 23 mg/dL   Creatinine, Ser 1.47  0.50 - 1.10 mg/dL   Calcium 9.0  8.4 - 82.9 mg/dL   Total Protein 7.5  6.0 - 8.3 g/dL   Albumin 3.2 (*) 3.5 - 5.2 g/dL   AST 17  0 - 37 U/L   ALT 12  0 - 35 U/L   Alkaline Phosphatase 33 (*) 39 - 117 U/L   Total Bilirubin 0.5  0.3 - 1.2 mg/dL   GFR calc non Af Amer >90  >90 mL/min   GFR calc Af Amer >90  >90 mL/min   Anion gap 13  5 - 15  LIPASE, BLOOD      Result Value Ref Range    Lipase 21  11 - 59 U/L  URINALYSIS, ROUTINE W REFLEX MICROSCOPIC      Result Value Ref Range   Color, Urine AMBER (*) YELLOW   APPearance CLEAR  CLEAR   Specific Gravity, Urine 1.018  1.005 - 1.030   pH 6.0  5.0 - 8.0   Glucose, UA NEGATIVE  NEGATIVE mg/dL   Hgb urine dipstick TRACE (*) NEGATIVE   Bilirubin Urine NEGATIVE  NEGATIVE   Ketones, ur 40 (*) NEGATIVE mg/dL   Protein, ur NEGATIVE  NEGATIVE mg/dL   Urobilinogen, UA 1.0  0.0 - 1.0 mg/dL   Nitrite NEGATIVE  NEGATIVE   Leukocytes, UA TRACE (*) NEGATIVE  URINE MICROSCOPIC-ADD ON      Result Value Ref Range   Squamous Epithelial / LPF FEW (*) RARE   WBC, UA 0-2  <3 WBC/hpf   RBC / HPF 3-6  <3 RBC/hpf   Bacteria, UA MANY (*) RARE   Urine-Other MUCOUS PRESENT     No results found.   MDM   Final diagnoses:  Fall at nursing home, subsequent encounter    68 year old female with second fall today, recent diagnosis of urinary tract infection.  Will hold patient in the emergency department until social work /case management and evaluation in the morning    Olivia Mackie, MD 09/02/14 [phone]

## 2014-09-02 NOTE — Progress Notes (Signed)
CSW spoke with patient son regarding expectations of facility. Patient son and csw discussed options for private duty nursing to assist with patient need for one on one care. CSW consulted rn cm regarding private duty options, as patient has medicaid and elligible for private duty. Patient also recommended for physical thearpy due to frequent falls, and SW to assist with further resources. CSW confirmed with [facility] to facility. CSW followed up with EDP regarding discharge plan. RN to call report to CogdellAshley at [phone].   Crystal HesselbachKristen Latori Beggs, LCSW [phone]  ED CSW 09/02/2014 1510pm

## 2014-09-02 NOTE — ED Notes (Signed)
Call pt's son whenever patient is to be transported back to facility, number in chart.

## 2014-09-02 NOTE — ED Provider Notes (Signed)
Spoke with case management.  Patient evaluated by PT.  WIll be discharged back to facility with one and one nursing.  Orders for face-to-face home health evaluation placed for PT and other resources.  After history, exam, and medical workup I feel the patient has been appropriately medically screened and is safe for discharge home. Pertinent diagnoses were discussed with the patient. Patient was given return precautions.   Shon Batonourtney F Austan Nicholl, MD 09/02/14 [phone]

## 2014-09-02 NOTE — ED Notes (Signed)
Bed: ZO10WA05 Expected date:  Expected time:  Means of arrival:  Comments: EMS/68 yo female-just seen here for UTI[facility]

## 2014-09-02 NOTE — ED Notes (Signed)
Patient had an unwitnessed fall after being seen in ED earlier this evening and treated for a UTI.

## 2014-09-02 NOTE — Progress Notes (Addendum)
CS[facility] with [facility] who shared that patient has been complaining of right flank pain, and has been falling asleep quickly and falling out of her chair. Per Morrie Sheldonshley this is not usual for patient, as she is very independent at baseline.  CSW consulted with EDP and discussed admission status. EDP order PT to evaluate patient to determine if higher level of care is needed. CSW left message with patient son, Karis JubaMatthew Muscatello at [phone]  Byrd HesselbachKristen Sonam Wandel, LCSW [phone]  ED CSW 09/02/2014 854am   CSW spoke with patient son, Karis Jubamatthew Brahmbhatt who stated that he would like patient to go to skilled nursing if possible due to patient UTI and falls. However if patient is unable he is willing to make further arrangements [facility]. CSW awaiting for Pt eval. Charge nurse assisting with PT to come to evaluate patient.   Byrd HesselbachKristen Jaislyn Blinn, LCSW [phone]  ED CSW 09/02/2014 1047am

## 2014-09-02 NOTE — Progress Notes (Signed)
09/02/2014 A. Bennie DallasFerrero RNCM 2115pm EDCM called patient's son Molli HazardMatthew x2 without success.  Voice message on patient's son's phone reports his mail box is full and cannot accept any more messages at this time.

## 2014-09-02 NOTE — Progress Notes (Addendum)
[facility]. Patient is stable at discharge. Son is with patient at discharge. Report given to Con-wayracy Med Tech Supervisor at [facility].

## 2014-09-02 NOTE — Evaluation (Signed)
Physical Therapy Evaluation Patient Details Name: Crystal Davila MRN: [id number] DOB: Mar 28, 1946 Today's Date: 09/02/2014   History of Present Illness  68 y.o. female presenting with generalized weakness.  She slid out of her chair today; PMHx: dementia  Clinical Impression  Pt seen this date for one time PT eval, possible return to [facility] today; Pt will need continued HHPT to work on balance, gait, safety; May need  incr supervision, assist for mobility to prevent falls;    Follow Up Recommendations Home health PT;Supervision for mobility/OOB    Equipment Recommendations  None recommended by PT (may need RW, defer to HHPT)    Recommendations for Other Services       Precautions / Restrictions Precautions Precautions: Fall      Mobility  Bed Mobility Overal bed mobility: Needs Assistance Bed Mobility: Supine to Sit;Sit to Supine     Supine to sit: Min assist Sit to supine: Min assist   General bed mobility comments: incr time and cues to complete task  Transfers Overall transfer level: Needs assistance Equipment used: None Transfers: Sit to/from Stand Sit to Stand: Min assist         General transfer comment: cues for safety, task initiation  Ambulation/Gait Ambulation/Gait assistance: Min assist Ambulation Distance (Feet): 100 Feet Assistive device: None;1 person hand held assist Gait Pattern/deviations: Step-through pattern;Drifts right/left;Staggering right;Staggering left;Trunk flexed;Decreased stride length     General Gait Details: pt with LOB x 3,  requiring min assist to recover  Stairs            Wheelchair Mobility    Modified Rankin (Stroke Patients Only)       Balance Overall balance assessment: Needs assistance Sitting-balance support: Feet supported;No upper extremity supported Sitting balance-Leahy Scale: Fair       Standing balance-Leahy Scale: Fair               High level balance activites: Direction  changes;Turns;Head turns High Level Balance Comments: LOB x 3 with min to recover             Pertinent Vitals/Pain Pain Assessment: Faces Faces Pain Scale: Hurts little more Pain Location: right side Pain Intervention(s): Monitored during session    Home Living Family/patient expects to be discharged to:: Other (Comment)                 Additional Comments: resides at [place] unit at [facility]    Prior Function           Comments: pt is an independent ambulator at baseline per son     Hand Dominance        Extremity/Trunk Assessment   Upper Extremity Assessment: Generalized weakness           Lower Extremity Assessment: Generalized weakness         Communication      Cognition Arousal/Alertness: Awake/alert Behavior During Therapy: WFL for tasks assessed/performed Overall Cognitive Status: Within Functional Limits for tasks assessed                      General Comments      Exercises        Assessment/Plan    PT Assessment All further PT needs can be met in the next venue of care  PT Diagnosis Difficulty walking   PT Problem List    PT Treatment Interventions     PT Goals (Current goals can be found in the Care Plan section) Acute Rehab  PT Goals Patient Stated Goal: pt does not state PT Goal Formulation: No goals set, d/c therapy Potential to Achieve Goals: Good    Frequency     Barriers to discharge        Co-evaluation               End of Session Equipment Utilized During Treatment: Gait belt Activity Tolerance: Patient tolerated treatment well Patient left: in bed;with call bell/phone within reach;with family/visitor present Nurse Communication: Mobility status    Functional Assessment Tool Used: clinical judgement Functional Limitation: Mobility: Walking and moving around Mobility: Walking and Moving Around Current Status (D6222): At least 1 percent but less than 20 percent impaired, limited  or restricted Mobility: Walking and Moving Around Goal Status [phone]): At least 1 percent but less than 20 percent impaired, limited or restricted Mobility: Walking and Moving Around Discharge Status [phone]): At least 1 percent but less than 20 percent impaired, limited or restricted    Time: 1740-8144 PT Time Calculation (min): 15 min   Charges:   PT Evaluation $Initial PT Evaluation Tier I: 1 Procedure PT Treatments $Gait Training: 8-22 mins   PT G Codes:   Functional Assessment Tool Used: clinical judgement Functional Limitation: Mobility: Walking and moving around    [facility] 09/02/2014, 4:07 PM

## 2014-09-02 NOTE — Discharge Instructions (Signed)
Fall Prevention and Home Safety °Falls cause injuries and can affect all age groups. It is possible to prevent falls.  °HOW TO PREVENT FALLS °· Wear shoes with rubber soles that do not have an opening for your toes. °· Keep the inside and outside of your house well lit. °· Use night lights throughout your home. °· Remove clutter from floors. °· Clean up floor spills. °· Remove throw rugs or fasten them to the floor with carpet tape. °· Do not place electrical cords across pathways. °· Put grab bars by your tub, shower, and toilet. Do not use towel bars as grab bars. °· Put handrails on both sides of the stairway. Fix loose handrails. °· Do not climb on stools or stepladders, if possible. °· Do not wax your floors. °· Repair uneven or unsafe sidewalks, walkways, or stairs. °· Keep items you use a lot within reach. °· Be aware of pets. °· Keep emergency numbers next to the telephone. °· Put smoke detectors in your home and near bedrooms. °Ask your doctor what other things you can do to prevent falls. °Document Released: 09/10/2009 Document Revised: 05/15/2012 Document Reviewed: 02/14/2012 °ExitCare® Patient Information ©2015 [employer]. This information is not intended to replace advice given to you by your health care provider. Make sure you discuss any questions you have with your health care provider. ° °

## 2014-09-02 NOTE — ED Notes (Signed)
[facility] called for transportation  

## 2014-09-02 NOTE — ED Notes (Signed)
Pt alert, oriented to person and place. Transferred from main ED setting for closer observation for safety. Pt cooperative, yet restless. Fidgeting with gown and depend. Attempting to pull off while being reoriented the purpose of the depend. Denies being uncomfortable. Assessed that some responses pt gives does not relate to questions. Will continue to monitor.

## 2014-09-02 NOTE — ED Notes (Addendum)
Jayme CloudElizabeth Fisher [facility] to inquire about patient discharge status today.

## 2014-09-02 NOTE — ED Notes (Signed)
Family at bedside. 

## 2014-09-02 NOTE — Progress Notes (Signed)
  CARE MANAGEMENT ED NOTE 09/02/2014  Patient:  Crystal Davila,Crystal Davila   Account Number:  [id number]  Date Initiated:  09/02/2014  Documentation initiated by:  Edd ArbourGIBBS,KIMBERLY  Subjective/Objective Assessment:   68 yr old united health care aarp medicare complete pt c/o unwitnessed fall after being seen in ED earlier this evening and treated for a UTI.     Subjective/Objective Assessment Detail:   pcp [facility]bert myles at [facility]     Action/Plan:   EPIC updated   Action/Plan Detail:   Anticipated DC Date:       Status Recommendation to Physician:   Result of Recommendation:    Other ED Services  Consult Working Plan    DC Planning Services  Other  PCP issues    Choice offered to / List presented to:            Status of service:  Completed, signed off  ED Comments:   ED Comments Detail:

## 2014-09-02 NOTE — ED Notes (Signed)
Patient no longer needs posey belt restraint. Patient is calm and cooperative. Patient is reoriented successfully with direction.

## 2014-09-03 ENCOUNTER — Emergency Department (HOSPITAL_COMMUNITY): Payer: Medicare Other

## 2014-09-03 ENCOUNTER — Emergency Department (HOSPITAL_COMMUNITY)
Admission: EM | Admit: 2014-09-03 | Discharge: 2014-09-04 | Disposition: A | Discharge status: Home / Self Care | Payer: Medicare Other | Attending: Emergency Medicine | Admitting: Emergency Medicine

## 2014-09-03 ENCOUNTER — Encounter (HOSPITAL_COMMUNITY): Payer: Self-pay | Admitting: Emergency Medicine

## 2014-09-03 DIAGNOSIS — W19XXXD Unspecified fall, subsequent encounter: Secondary | ICD-10-CM

## 2014-09-03 DIAGNOSIS — W1839XD Other fall on same level, subsequent encounter: Secondary | ICD-10-CM | POA: Diagnosis not present

## 2014-09-03 DIAGNOSIS — F0390 Unspecified dementia without behavioral disturbance: Secondary | ICD-10-CM | POA: Insufficient documentation

## 2014-09-03 DIAGNOSIS — S3981XD Other specified injuries of abdomen, subsequent encounter: Secondary | ICD-10-CM | POA: Diagnosis present

## 2014-09-03 DIAGNOSIS — Z792 Long term (current) use of antibiotics: Secondary | ICD-10-CM | POA: Diagnosis not present

## 2014-09-03 DIAGNOSIS — Z79899 Other long term (current) drug therapy: Secondary | ICD-10-CM | POA: Insufficient documentation

## 2014-09-03 LAB — URINE CULTURE
Colony Count: NO GROWTH
Culture: NO GROWTH

## 2014-09-03 LAB — COMPREHENSIVE METABOLIC PANEL
ALK PHOS: 34 U/L — AB (ref 39–117)
ALT: 14 U/L (ref 0–35)
AST: 25 U/L (ref 0–37)
Albumin: 3.4 g/dL — ABNORMAL LOW (ref 3.5–5.2)
Anion gap: 12 (ref 5–15)
BUN: 13 mg/dL (ref 6–23)
CHLORIDE: 94 meq/L — AB (ref 96–112)
CO2: 29 meq/L (ref 19–32)
Calcium: 9.2 mg/dL (ref 8.4–10.5)
Creatinine, Ser: 0.68 mg/dL (ref 0.50–1.10)
GFR calc Af Amer: >90 mL/min (ref >90)
GFR, EST NON AFRICAN AMERICAN: 87 mL/min — AB (ref >90)
Glucose, Bld: 123 mg/dL — ABNORMAL HIGH (ref 70–99)
Potassium: 3.8 mEq/L (ref 3.7–5.3)
Sodium: 135 mEq/L — ABNORMAL LOW (ref 137–147)
Total Bilirubin: 0.6 mg/dL (ref 0.3–1.2)
Total Protein: 7.3 g/dL (ref 6.0–8.3)

## 2014-09-03 LAB — CBC WITH DIFFERENTIAL/PLATELET
BASOS ABS: 0 10*3/uL (ref 0.0–0.1)
Basophils Relative: 0 % (ref 0–1)
Eosinophils Absolute: 0.1 10*3/uL (ref 0.0–0.7)
Eosinophils Relative: 1 % (ref 0–5)
HEMATOCRIT: 43.3 % (ref 36.0–46.0)
Hemoglobin: 14.6 g/dL (ref 12.0–15.0)
Lymphocytes Relative: 15 % (ref 12–46)
Lymphs Abs: 1.2 10*3/uL (ref 0.7–4.0)
MCH: 31.1 pg (ref 26.0–34.0)
MCHC: 33.7 g/dL (ref 30.0–36.0)
MCV: 92.1 fL (ref 78.0–100.0)
Monocytes Absolute: 0.8 10*3/uL (ref 0.1–1.0)
Monocytes Relative: 10 % (ref 3–12)
NEUTROS ABS: 6 10*3/uL (ref 1.7–7.7)
Neutrophils Relative %: 74 % (ref 43–77)
PLATELETS: 204 10*3/uL (ref 150–400)
RBC: 4.7 MIL/uL (ref 3.87–5.11)
RDW: 12.3 % (ref 11.5–15.5)
WBC: 8.1 10*3/uL (ref 4.0–10.5)

## 2014-09-03 LAB — LIPASE, BLOOD: Lipase: 37 U/L (ref 11–59)

## 2014-09-03 MED ORDER — CEPHALEXIN 500 MG PO CAPS
500.0000 mg | ORAL_CAPSULE | Freq: Once | ORAL | Status: AC
  Administered 2014-09-03: 500 mg via ORAL
  Filled 2014-09-03: qty 1

## 2014-09-03 NOTE — ED Provider Notes (Signed)
CSN: [id number]     Arrival date & time 09/03/14  1746 History   First MD Initiated Contact with Patient 09/03/14 2103     Chief Complaint  Patient presents with  . Flank Pain   Level V caveat, patient with dementia. History obtained is limited  (Consider location/radiation/quality/duration/timing/severity/associated sxs/prior Treatment) HPI Crystal Davila is a 68 y.o. female severe dementia who presents from her memory care facility and is accompanied by her son for evaluation after a fall. History is given by son he says patient was recently seen and evaluated here after his mother fell yesterday. Her workup revealed UTI but no other significant findings. He reports nothing has changed that he is concerned we may have missed something in her initial workup. He reports sometimes she will hold her right side. Son is concerned nursing staff is not watching his mother closely enough. Has been discussing course of action with social work.  Past Medical History  Diagnosis Date  . Dementia    Past Surgical History  Procedure Laterality Date  . Orif wrist fracture Bilateral 02/03/2013    Procedure: OPEN REDUCTION INTERNAL FIXATION (ORIF) RIGHT WRIST FRACTURE,  CLOSED REDUCTION LEFT WRIST;  Surgeon: Johnette Abraham, MD;  Location: [facility] OR;  Service: Plastics;  Laterality: Bilateral;   No family history on file. History  Substance Use Topics  . Smoking status: Never Smoker   . Smokeless tobacco: Never Used  . Alcohol Use: No   OB History   Grav Para Term Preterm Abortions TAB SAB Ect Mult Living                 Review of Systems  Unable to perform ROS     Allergies  Tamiflu  Home Medications   Prior to Admission medications   Medication Sig Start Date End Date Taking? Authorizing Provider  cephALEXin (KEFLEX) 500 MG capsule Take 1 capsule (500 mg total) by mouth 2 (two) times daily. 09/01/14  Yes Mellody Drown, PA-C  divalproex (DEPAKOTE ER) 250 MG 24 hr tablet Take 250 mg by  mouth 2 (two) times daily.   Yes Historical Provider, MD  donepezil (ARICEPT) 10 MG tablet Take 10 mg by mouth at bedtime.    Yes Historical Provider, MD  escitalopram (LEXAPRO) 20 MG tablet Take 20 mg by mouth daily.   Yes Historical Provider, MD  Memantine HCl ER (NAMENDA XR) 7 MG CP24 Take 7 mg by mouth daily.    Yes Historical Provider, MD  traMADol (ULTRAM) 50 MG tablet Take 50 mg by mouth every 6 (six) hours as needed (for pain).  02/05/13  Yes Freeman Caldron, PA-C  acetaminophen (TYLENOL) 500 MG tablet Take 500 mg by mouth every 4 (four) hours as needed for mild pain or fever.    Historical Provider, MD  alum & mag hydroxide-simeth (MAALOX/MYLANTA) 200-200-20 MG/5ML suspension Take 30 mLs by mouth 4 (four) times daily as needed for indigestion or heartburn.    Historical Provider, MD  guaifenesin (ROBITUSSIN) 100 MG/5ML syrup Take 200 mg by mouth every 6 (six) hours as needed for cough.    Historical Provider, MD  loperamide (IMODIUM) 2 MG capsule Take 2 mg by mouth as needed for diarrhea or loose stools.    Historical Provider, MD  magnesium hydroxide (MILK OF MAGNESIA) 400 MG/5ML suspension Take 30 mLs by mouth at bedtime as needed for mild constipation.    Historical Provider, MD  neomycin-bacitracin-polymyxin (NEOSPORIN) ointment Apply 1 application topically as needed for wound care.  apply to eye    Historical Provider, MD  temazepam (RESTORIL) 7.5 MG capsule Take 7.5 mg by mouth at bedtime as needed for sleep.    Historical Provider, MD   BP 131/63  Pulse 86  Temp(Src) 98 F (36.7 C) (Oral)  Resp 18  SpO2 98% Physical Exam  Nursing note and vitals reviewed. Constitutional: She is oriented to person, place, and time. She appears well-developed and well-nourished.  HENT:  Head: Normocephalic and atraumatic.  Mouth/Throat: Oropharynx is clear and moist.  Eyes: Conjunctivae are normal. Pupils are equal, round, and reactive to light. Right eye exhibits no discharge. Left eye  exhibits no discharge. No scleral icterus.  Neck: Neck supple.  Cardiovascular: Normal rate, regular rhythm and normal heart sounds.   Pulmonary/Chest: Effort normal and breath sounds normal. No respiratory distress. She has no wheezes. She has no rales.  Abdominal: Soft. She exhibits no distension and no mass. There is no tenderness. There is no rebound and no guarding.  Musculoskeletal: She exhibits no tenderness.  Patient appears to have baseline range of motion with no appreciable tenderness. No other obvious lesions, rashes or other deformities noted  Neurological: She is alert and oriented to person, place, and time.  Cranial Nerves II-XII grossly intact  Skin: Skin is warm and dry. No rash noted.  Psychiatric: She has a normal mood and affect.    ED Course  Procedures (including critical care time) Labs Review Labs Reviewed - No data to display  Imaging Review Dg Lumbar Spine 2-3 Views  09/02/2014   CLINICAL DATA:  Unwitnessed fall while at nursing home last night. Patient has current history of dementia and cannot describe fall in further detail. Lower back pain, acute onset. Initial encounter.  EXAM: LUMBAR SPINE - 2-3 VIEW  COMPARISON:  None.  FINDINGS: There is approximately 25% loss of height noted at vertebral body L1. This is of indeterminate age, without evidence of significant retropulsion. There is mild chronic grade 1 anterolisthesis of L4 on L5. Mild disc space narrowing is noted at L2-L3.  The visualized bowel gas pattern is unremarkable in appearance; air and stool are noted within the colon. The sacroiliac joints are within normal limits.  IMPRESSION: Approximately 25% loss of height noted at vertebral body L1. This is of indeterminate age, without evidence of significant retropulsion.   Electronically Signed   By: Roanna Raider M.D.   On: 09/02/2014 03:09     EKG Interpretation None     Meds given in ED:  Medications  cephALEXin (KEFLEX) capsule 500 mg (500 mg  Oral Given 09/03/14 2252)    New Prescriptions   No medications on file   Filed Vitals:   09/03/14 1756 09/03/14 2109 09/03/14 2110 09/03/14 2236  BP: 146/97 131/63  129/67  Pulse: 88  86 76  Temp: 98 F (36.7 C)   98.2 F (36.8 C)  TempSrc: Oral   Oral  Resp: 18   18  SpO2: 96%  98% 97%    MDM  Vitals stable - WNL -afebrile Pt resting comfortably in ED. No sign of distress PE unremarkable and shows no evidence of acute or emergent pathology at this time. Labwork noncontributory Imaging shows no sign of acute fracture, dislocation or other osseous abnormality. Bowel gas pattern unremarkable.  Low concern for other acute fracture or emergent intra-abdominal pathology. Patient appears to be at her baseline. Keflex given in ED as continuation for treatment of her UTI. Will discharge back to her facility  Discussed  f/u with PCP and return precautions, pt/son very amenable to plan. Patient stable, in good condition and is appropriate for discharge   Prior to patient discharge, I discussed and reviewed this case with Dr.Harrison    Final diagnoses:  Fall, subsequent encounter  Dementia, without behavioral disturbance        Sharlene MottsBenjamin W Birttany Dechellis, PA-C 09/04/14 0029

## 2014-09-03 NOTE — ED Notes (Signed)
Pt's son states that pt was here 3 different times yesterday from three different falls.  Pt lives in memory care unit and when son went to visit her this afternoon, pt was supposed to be "being watched more closely" which son states isn't happening.  Staff states pt has been complaining of rt sided flank pain since falls yesterday.

## 2014-09-03 NOTE — Progress Notes (Addendum)
Late entry for 09/02/14 1630 CARE MANAGEMENT ED NOTE 09/03/2014  Patient:  Crystal Davila,Crystal Davila   Account Number:  [id number]  Date Initiated:  09/02/2014  Documentation initiated by:  Edd ArbourGIBBS,Tyjay Galindo  Subjective/Objective Assessment:   68 yr old united health care aarp medicare complete/Medicaid pt c/o unwitnessed fall after being seen in ED earlier this evening and treated for a UTI.     Subjective/Objective Assessment Detail:   [facility]  [facility]  Crystal JubaMatthew Welz son cell [phone]  son reports there [facility] SW[facility] Reports he owes money to the facility & pt has been given a "thirty day notice" of eviction Reports he has an "upcoming court case" "I know they just can't throw her out"  Son stated he had someone to stay with pt at facility but then made request that Cm call [facility] for him  son told CM ans SW about how pt has been at ALF for 2 yrs and his wife left him and he is taking care of 2 children, working, pt falling at facility with "one time had a black eye" Informed CM he paid "forty two hundred dollars" to facility Son states he has spoken to "Esmond HarpsKim Jones" but have difficulty getting return calls  pcp seen at facility is Dorinda HillMyles Herbert Son states pt was seeing another MD but facility staff encouraged him to let house doctor see pt States pt in room with another lady who has been there longer than pt and has hospice services Inquired about other medicaid services for pt  Reports pt has DME walker, Hospital bed he can borrow from a relative "when she comes home"  Son noted to have a red folder with standard notebook paper that he placed all resources provided to him in He stated " I should have been taking notes"     Action/Plan:   EPIC updated ED CM consulted by ED SW for private duty nursing (PDN) assistance Cm and SW spoke with son, Crystal Davila about PDN at ALF, home health PT, requesting assist from ALF SW/CM   Discussed levels of care for patients Reviewed CAPs   Action/Plan Detail:   Provided a pamphlet on PACE, Ombudsman, list of Private duty nursing services, choice list of [place] home health agencies to choose agency  Wrote notes for DSS, CAPs on resources given CM business card provided   Anticipated DC Date:  09/02/2014     Status Recommendation to Physician:   Result of Recommendation:    Other ED Services  Consult Working Plan    DC Planning Services  Other  PCP issues    Choice offered to / List presented to:            Status of service:  Completed, signed off  ED Comments:   ED Comments Detail:    09/02/2014 A. Bennie DallasFerrero RNCM 2115pm EDCM called patient's son Molli HazardMatthew x2 without success. Voice message on patient's son's phone reports his mail box is full and cannot accept any more messages at this time.  09/02/14 1554 [facility] Cm spoke with Dorene GrebeMary of [facility] [phone] to set up HHPT services for [facility] updated [facility] Son standing in front of CM as information provided [address] EDP updated by CM Orders in EPIC for HHPT 1531 [facility] Cm spoke with Corrie DandyMary who states [facility]  not contracted with united health care to provide services Son encourage to call DSS at [phone] to find out who pt medicaid case worker is to get community assistance with services available to pt 1529 Spoke with Verlon Au at [facility] [phone] as courtesy to son to request call to son about PDN services Son standing in front of CM as information provided Verlon Au provided general information for ED visit for 09/02/14  CM reviewed in details medicare guidelines, home health [facility]) (length of stay in home, types of [facility] staff available, coverage, primary caregiver, up to 24 hrs before services may be started), Private duty nursing (PDN-coverage, length of stay in the home types of staff available), assisted  living (ASL- coverage, services offered) and Skilled nursing facilities (snf- coverage and services offered) Discussed pt to be further evaluated by [facility] therapists (PT/OT) for recommendation of level of care and share this with EDP

## 2014-09-03 NOTE — Progress Notes (Addendum)
09/03/14 1359 fax confirmation of faxed clinicals [facility] 106/15 received (fax [phone])  1335 Cm received voice message from [facility] on Cm office phone on 106/15 at 1634 Cm returned call to him He reports [facility] not being able to stay with pt until she was at home "double billing"  Pt has not brought pt her walker at alf.  He states pt is been [facility] When CM inquired about him asking for assist to get pt to snf Molli HazardMatthew stated he and facility staff felt it is better to keep pt at present alf because not sure if pt would get better care at another facility

## 2014-09-03 NOTE — Progress Notes (Signed)
Case management has seen this patient and spoke to patient's son on 10/06 and 10/07 and was provided all available resources. Please see CM notes.  No further EDCM needs at this time.

## 2014-09-04 NOTE — ED Provider Notes (Signed)
Medical screening examination/treatment/procedure(s) were conducted as a shared visit with non-physician practitioner(s) and myself.  I personally evaluated the patient during the encounter.   EKG Interpretation None      I interviewed and examined the patient. Lungs are CTAB. Cardiac exam wnl. Abdomen soft. Normal rom of hips w/out pain. Seen here recently for fall. L1 indet age comp fx noted then on plain films. Son reporting that pt grabbing her right flank sometimes. She is in no distress on my exam. No focal ttp of abd or chest. VS unremarkable. Sx maybe related to L1 comp fx and pt not able to localize pain to this area given her dementia. I interpreted/reviewed the labs and/or imaging which were non-contributory.  Will cont tx for UTI and rec close f/u w/ pcp. Son understands.   Purvis SheffieldForrest Harout Scheurich, MD 09/04/14 [phone]

## 2014-09-04 NOTE — Discharge Instructions (Signed)

## 2014-09-11 NOTE — Progress Notes (Signed)
ED CM received [facility] back [facility] [phone][facility] to state pt is not a PACE candidate after speaking with son, Molli HazardMatthew. Reviewed case information again. States son offered resources by [facility] staff.

## 2014-11-01 ENCOUNTER — Encounter (HOSPITAL_COMMUNITY): Payer: Self-pay | Admitting: *Deleted

## 2014-11-01 ENCOUNTER — Emergency Department (HOSPITAL_COMMUNITY): Payer: Medicare Other

## 2014-11-01 ENCOUNTER — Emergency Department (HOSPITAL_COMMUNITY)
Admission: EM | Admit: 2014-11-01 | Discharge: 2014-11-01 | Disposition: A | Discharge status: Home / Self Care | Payer: Medicare Other | Attending: Emergency Medicine | Admitting: Emergency Medicine

## 2014-11-01 DIAGNOSIS — Z8781 Personal history of (healed) traumatic fracture: Secondary | ICD-10-CM | POA: Diagnosis not present

## 2014-11-01 DIAGNOSIS — Z008 Encounter for other general examination: Secondary | ICD-10-CM | POA: Insufficient documentation

## 2014-11-01 DIAGNOSIS — Z79899 Other long term (current) drug therapy: Secondary | ICD-10-CM | POA: Diagnosis not present

## 2014-11-01 DIAGNOSIS — N390 Urinary tract infection, site not specified: Secondary | ICD-10-CM | POA: Insufficient documentation

## 2014-11-01 DIAGNOSIS — F0390 Unspecified dementia without behavioral disturbance: Secondary | ICD-10-CM | POA: Diagnosis not present

## 2014-11-01 DIAGNOSIS — R4182 Altered mental status, unspecified: Secondary | ICD-10-CM | POA: Diagnosis present

## 2014-11-01 DIAGNOSIS — Z139 Encounter for screening, unspecified: Secondary | ICD-10-CM

## 2014-11-01 LAB — URINE MICROSCOPIC-ADD ON

## 2014-11-01 LAB — COMPREHENSIVE METABOLIC PANEL
ALK PHOS: 46 U/L (ref 39–117)
ALT: 9 U/L (ref 0–35)
AST: 17 U/L (ref 0–37)
Albumin: 3.3 g/dL — ABNORMAL LOW (ref 3.5–5.2)
Anion gap: 11 (ref 5–15)
BILIRUBIN TOTAL: 0.3 mg/dL (ref 0.3–1.2)
BUN: 11 mg/dL (ref 6–23)
CHLORIDE: 100 meq/L (ref 96–112)
CO2: 29 meq/L (ref 19–32)
Calcium: 9.2 mg/dL (ref 8.4–10.5)
Creatinine, Ser: 0.71 mg/dL (ref 0.50–1.10)
GFR calc Af Amer: >90 mL/min (ref >90)
GFR calc non Af Amer: 85 mL/min — ABNORMAL LOW (ref >90)
Glucose, Bld: 102 mg/dL — ABNORMAL HIGH (ref 70–99)
Potassium: 4.6 mEq/L (ref 3.7–5.3)
Sodium: 140 mEq/L (ref 137–147)
Total Protein: 7.5 g/dL (ref 6.0–8.3)

## 2014-11-01 LAB — PROTIME-INR
INR: 1.06 (ref 0.00–1.49)
Prothrombin Time: 13.9 seconds (ref 11.6–15.2)

## 2014-11-01 LAB — URINALYSIS, ROUTINE W REFLEX MICROSCOPIC
Bilirubin Urine: NEGATIVE
GLUCOSE, UA: 500 mg/dL — AB
KETONES UR: NEGATIVE mg/dL
Nitrite: POSITIVE — AB
PH: 6.5 (ref 5.0–8.0)
Protein, ur: 30 mg/dL — AB
Specific Gravity, Urine: 1.016 (ref 1.005–1.030)
Urobilinogen, UA: 1 mg/dL (ref 0.0–1.0)

## 2014-11-01 LAB — CBG MONITORING, ED
GLUCOSE-CAPILLARY: 43 mg/dL — AB (ref 70–99)
GLUCOSE-CAPILLARY: 174 mg/dL — AB (ref 70–99)
GLUCOSE-CAPILLARY: 116 mg/dL — AB (ref 70–99)
Glucose-Capillary: 47 mg/dL — ABNORMAL LOW (ref 70–99)
Glucose-Capillary: 69 mg/dL — ABNORMAL LOW (ref 70–99)
Glucose-Capillary: 180 mg/dL — ABNORMAL HIGH (ref 70–99)
Glucose-Capillary: 185 mg/dL — ABNORMAL HIGH (ref 70–99)

## 2014-11-01 LAB — CBC WITH DIFFERENTIAL/PLATELET
BASOS ABS: 0 10*3/uL (ref 0.0–0.1)
Basophils Relative: 0 % (ref 0–1)
EOS PCT: 1 % (ref 0–5)
Eosinophils Absolute: 0.1 10*3/uL (ref 0.0–0.7)
HEMATOCRIT: 44.5 % (ref 36.0–46.0)
Hemoglobin: 14.6 g/dL (ref 12.0–15.0)
Lymphocytes Relative: 20 % (ref 12–46)
Lymphs Abs: 1.3 10*3/uL (ref 0.7–4.0)
MCH: 31.3 pg (ref 26.0–34.0)
MCHC: 32.8 g/dL (ref 30.0–36.0)
MCV: 95.5 fL (ref 78.0–100.0)
MONO ABS: 0.5 10*3/uL (ref 0.1–1.0)
Monocytes Relative: 8 % (ref 3–12)
Neutro Abs: 4.8 10*3/uL (ref 1.7–7.7)
Neutrophils Relative %: 71 % (ref 43–77)
Platelets: 200 10*3/uL (ref 150–400)
RBC: 4.66 MIL/uL (ref 3.87–5.11)
RDW: 12.7 % (ref 11.5–15.5)
WBC: 6.8 10*3/uL (ref 4.0–10.5)

## 2014-11-01 LAB — I-STAT CG4 LACTIC ACID, ED
LACTIC ACID, VENOUS: 2.7 mmol/L — AB (ref 0.5–2.2)
LACTIC ACID, VENOUS: 1.84 mmol/L (ref 0.5–2.2)

## 2014-11-01 LAB — APTT: aPTT: 27 seconds (ref 24–37)

## 2014-11-01 MED ORDER — DEXTROSE 50 % IV SOLN
50.0000 mL | Freq: Once | INTRAVENOUS | Status: AC
  Administered 2014-11-01: 50 mL via INTRAVENOUS
  Filled 2014-11-01: qty 50

## 2014-11-01 MED ORDER — SODIUM CHLORIDE 0.9 % IV SOLN
1000.0000 mL | INTRAVENOUS | Status: DC
  Administered 2014-11-01: 1000 mL via INTRAVENOUS

## 2014-11-01 MED ORDER — CEFTRIAXONE SODIUM 1 G IJ SOLR
1.0000 g | Freq: Once | INTRAMUSCULAR | Status: AC
  Administered 2014-11-01: 1 g via INTRAVENOUS
  Filled 2014-11-01: qty 10

## 2014-11-01 MED ORDER — CEPHALEXIN 500 MG PO CAPS: 500.0000 mg | ORAL_CAPSULE | Freq: Two times a day (BID) | ORAL | Status: AC

## 2014-11-01 MED ORDER — SODIUM CHLORIDE 0.9 % IV SOLN: INTRAVENOUS | Status: DC

## 2014-11-01 NOTE — ED Provider Notes (Signed)
CSN: [id number]     Arrival date & time 11/01/14  1536 History   First MD Initiated Contact with Patient 11/01/14 1538     Chief Complaint  Patient presents with  . Altered Mental Status     (Consider location/radiation/quality/duration/timing/severity/associated sxs/prior Treatment) HPI   Crystal Davila is a 68 y.o. female heart in by EMS with altered mental status for 1 day. [facility] she is sent in for evaluation of sleeping more than normal and also cyanosis on the bilateral fingertips. Patient has severe dementia. Level V caveat secondary to dementia. Patient with no complaints; specifically denies chest pain, abdominal pain, headache.  Past Medical History  Diagnosis Date  . Dementia    Past Surgical History  Procedure Laterality Date  . Orif wrist fracture Bilateral 02/03/2013    Procedure: OPEN REDUCTION INTERNAL FIXATION (ORIF) RIGHT WRIST FRACTURE,  CLOSED REDUCTION LEFT WRIST;  Surgeon: Johnette AbrahamHarrill C Coley, MD;  Location: [facility] OR;  Service: Plastics;  Laterality: Bilateral;   No family history on file. History  Substance Use Topics  . Smoking status: Never Smoker   . Smokeless tobacco: Never Used  . Alcohol Use: No   OB History    No data available     Review of Systems  Unable to perform ROS: Dementia     Allergies  Tamiflu  Home Medications   Prior to Admission medications   Medication Sig Start Date End Date Taking? Authorizing Provider  acetaminophen (TYLENOL) 500 MG tablet Take 500 mg by mouth every 4 (four) hours as needed for mild pain or fever.   Yes Historical Provider, MD  alum & mag hydroxide-simeth (MAALOX/MYLANTA) 200-200-20 MG/5ML suspension Take 30 mLs by mouth 4 (four) times daily as needed for indigestion or heartburn.   Yes Historical Provider, MD  divalproex (DEPAKOTE ER) 250 MG 24 hr tablet Take 250 mg by mouth 2 (two) times daily.   Yes Historical Provider, MD  donepezil (ARICEPT) 10 MG tablet Take 10 mg  by mouth at bedtime.    Yes Historical Provider, MD  escitalopram (LEXAPRO) 20 MG tablet Take 20 mg by mouth daily.   Yes Historical Provider, MD  guaifenesin (ROBITUSSIN) 100 MG/5ML syrup Take 200 mg by mouth every 6 (six) hours as needed for cough.   Yes Historical Provider, MD  loperamide (IMODIUM) 2 MG capsule Take 2 mg by mouth as needed for diarrhea or loose stools.   Yes Historical Provider, MD  magnesium hydroxide (MILK OF MAGNESIA) 400 MG/5ML suspension Take 30 mLs by mouth at bedtime as needed for mild constipation.   Yes Historical Provider, MD  neomycin-bacitracin-polymyxin (NEOSPORIN) ointment Apply 1 application topically as needed for wound care. apply to eye   Yes Historical Provider, MD  NYSTATIN, TOPICAL, POWD Apply 1 application topically 3 (three) times daily as needed (to affected areas).   Yes Historical Provider, MD  temazepam (RESTORIL) 7.5 MG capsule Take 7.5 mg by mouth at bedtime as needed for sleep.   Yes Historical Provider, MD  cephALEXin (KEFLEX) 500 MG capsule Take 1 capsule (500 mg total) by mouth 2 (two) times daily. 11/01/14   Karimah Winquist, PA-C   BP 110/50 mmHg  Pulse 80  Temp(Src) 97.8 F (36.6 C) (Rectal)  Resp 16  SpO2 100% Physical Exam  Constitutional: She appears well-developed and well-nourished. No distress.  HENT:  Head: Normocephalic.  Mouth/Throat: Oropharynx is clear and moist.  Eyes: Conjunctivae and EOM are normal. Pupils are equal,  round, and reactive to light.  Neck: Normal range of motion.  Cardiovascular: Normal rate, regular rhythm and intact distal pulses.   Fingertips are cyanotic but warm. Excellent range of motion, patient denies pain. Radial pulses palpable bilaterally.  Pulmonary/Chest: Effort normal and breath sounds normal. No stridor. No respiratory distress. She has no wheezes. She has no rales. She exhibits no tenderness.  Abdominal: Soft. Bowel sounds are normal. She exhibits no distension and no mass. There is no  tenderness. There is no rebound and no guarding.  Musculoskeletal: Normal range of motion. She exhibits no edema.  Neurological: She is alert.  Oriented to person and place only  Moving all extremities  Skin: Skin is warm.  Psychiatric: She has a normal mood and affect.  Nursing note and vitals reviewed.   ED Course  Procedures (including critical care time) Labs Review Labs Reviewed  URINALYSIS, ROUTINE W REFLEX MICROSCOPIC - Abnormal; Notable for the following:    APPearance TURBID (*)    Glucose, UA 500 (*)    Hgb urine dipstick LARGE (*)    Protein, ur 30 (*)    Nitrite POSITIVE (*)    Leukocytes, UA LARGE (*)    All other components within normal limits  COMPREHENSIVE METABOLIC PANEL - Abnormal; Notable for the following:    Glucose, Bld 102 (*)    Albumin 3.3 (*)    GFR calc non Af Amer 85 (*)    All other components within normal limits  URINE MICROSCOPIC-ADD ON - Abnormal; Notable for the following:    Bacteria, UA MANY (*)    All other components within normal limits  CBG MONITORING, ED - Abnormal; Notable for the following:    Glucose-Capillary 69 (*)    All other components within normal limits  CBG MONITORING, ED - Abnormal; Notable for the following:    Glucose-Capillary 47 (*)    All other components within normal limits  CBG MONITORING, ED - Abnormal; Notable for the following:    Glucose-Capillary 43 (*)    All other components within normal limits  I-STAT CG4 LACTIC ACID, ED - Abnormal; Notable for the following:    Lactic Acid, Venous 2.70 (*)    All other components within normal limits  CBG MONITORING, ED - Abnormal; Notable for the following:    Glucose-Capillary 180 (*)    All other components within normal limits  CBG MONITORING, ED - Abnormal; Notable for the following:    Glucose-Capillary 185 (*)    All other components within normal limits  CBG MONITORING, ED - Abnormal; Notable for the following:    Glucose-Capillary 174 (*)    All other  components within normal limits  CBG MONITORING, ED - Abnormal; Notable for the following:    Glucose-Capillary 116 (*)    All other components within normal limits  URINE CULTURE  CBC WITH DIFFERENTIAL  PROTIME-INR  APTT  I-STAT CG4 LACTIC ACID, ED    Imaging Review Dg Chest Port 1 View  11/01/2014   CLINICAL DATA:  Initial evaluation for confusion, dementia  EXAM: PORTABLE CHEST - 1 VIEW  COMPARISON:  09/03/2014  FINDINGS: Poor inspiratory effect. Mild diffuse interstitial prominence likely exaggerated by relative expiratory status of the radiograph. Heart size upper normal. Vascular pattern within normal limits.  IMPRESSION: Findings felt to be within normal limits given very limited inspiration.   Electronically Signed   By: Esperanza Heiraymond  Rubner M.D.   On: 11/01/2014 18:14     EKG Interpretation None  MDM   Final diagnoses:  Encounter for medical screening examination  UTI (lower urinary tract infection)    Filed Vitals:   11/01/14 1759 11/01/14 1812 11/01/14 1951 11/01/14 2200  BP:  111/59 113/57 110/50  Pulse:  81 80 80  Temp: 97.8 F (36.6 C)     TempSrc: Rectal     Resp:  18 16 16   SpO2:  99% 100% 100%    Medications  0.9 %  sodium chloride infusion (1,000 mLs Intravenous New Bag/Given 11/01/14 1759)  0.9 %  sodium chloride infusion (not administered)  dextrose 50 % solution 50 mL (50 mLs Intravenous Given 11/01/14 1739)  cefTRIAXone (ROCEPHIN) 1 g in dextrose 5 % 50 mL IVPB (0 g Intravenous Stopped 11/01/14 1903)    DIAMONE WHISTLER is a 68 y.o. female presenting by EMS [facility] Chas[place] sleeping alot fingers blue Son Said fingers tun blue when she has a UTI.   This is a shared visit with the attending physician who personally evaluated the patient and agrees with the care plan.   Neuro exam is nonfocal, patient will not comply with head CT. Will initiate other workup and then determine if head CT is necessary and if risk of sedation are  warranted.  Blood work with no significant abnormality, glucose is low at 43, patient given amp of D50 and it rises to 180. Urinalysis is consistent with infection, prior cultures with no growth to date. Patient will be started on a gram of Rocephin, urine culture sent. Lactic acid is 2.7. Will gently hydrate and recheck CBG.  Blood glucose remained stable, lactic acid has normalized. Vital signs stable, no indication for admission at this time. She will be discharged with Keflex, urine culture pending.  Evaluation does not show pathology that would require ongoing emergent intervention or inpatient treatment. Pt is hemodynamically stable and mentating appropriately. Discussed findings and plan with patient/guardian, who agrees with care plan. All questions answered. Return precautions discussed and outpatient follow up given.       Wynetta Emery, PA-C 11/01/14 2240  Toy Cookey, MD 11/02/14 1139  Toy Cookey, MD 11/02/14 1525

## 2014-11-01 NOTE — ED Notes (Signed)
Patient transported to X-ray 

## 2014-11-01 NOTE — ED Notes (Signed)
Per EMS, nursing home staff state the pt has had altered mental status for the past day. Pt has hx of dementia, staff did not tell EMS how pt's status was different from baseline. Nursing home staff state the pt has also had fingertip cyanosis for the past hour.

## 2014-11-01 NOTE — ED Notes (Signed)
Bed: WA21 Expected date: 11/01/14 Expected time: 3:22 PM Means of arrival: Ambulance Comments: AMS 1 day

## 2014-11-01 NOTE — Discharge Instructions (Signed)
°  Take your antibiotics as directed and to completion. You should never have any leftover antibiotics! Push fluids and stay well hydrated.  ° °Please follow with your primary care doctor in the next 2 days for a check-up. They must obtain records for further management.  ° °Do not hesitate to return to the Emergency Department for any new, worsening or concerning symptoms.  ° °

## 2014-11-01 NOTE — ED Notes (Signed)
[facility] called  

## 2014-11-04 LAB — URINE CULTURE: Colony Count: >=100000

## 2014-11-05 ENCOUNTER — Telehealth (HOSPITAL_BASED_OUTPATIENT_CLINIC_OR_DEPARTMENT_OTHER): Payer: Self-pay | Admitting: Emergency Medicine

## 2014-11-05 NOTE — Telephone Encounter (Signed)
Post ED Visit - Positive Culture Follow-up  Culture report reviewed by antimicrobial stewardship pharmacist: []  Wes Dulaney, Pharm.D., BCPS [x]  Celedonio MiyamotoJeremy Frens, Pharm.D., BCPS []  Georgina PillionElizabeth Martin, Pharm.D., BCPS []  Westbrook CenterMinh Pham, [address]Ph[address]D., BCPS, AAHIVP []  Estella HuskMichelle Turner, Pharm.D., BCPS, AAHIVP []  Babs BertinHaley Baird, [address]Pharm[address]D.   Positive urine culture E. Coli Treated with cephalexin, organism sensitive to the same and no further patient follow-up is required at this time.  Berle MullMiller, Bridie Colquhoun 11/05/2014, 12:22 PM

## 2014-12-17 ENCOUNTER — Other Ambulatory Visit (HOSPITAL_COMMUNITY): Payer: Medicare Other

## 2014-12-17 ENCOUNTER — Emergency Department (HOSPITAL_COMMUNITY)
Admission: EM | Admit: 2014-12-17 | Discharge: 2014-12-17 | Disposition: A | Discharge status: Home / Self Care | Payer: Medicare Other | Attending: Emergency Medicine | Admitting: Emergency Medicine

## 2014-12-17 ENCOUNTER — Encounter (HOSPITAL_COMMUNITY): Payer: Self-pay | Admitting: Emergency Medicine

## 2014-12-17 DIAGNOSIS — Z79899 Other long term (current) drug therapy: Secondary | ICD-10-CM | POA: Diagnosis not present

## 2014-12-17 DIAGNOSIS — Y998 Other external cause status: Secondary | ICD-10-CM | POA: Insufficient documentation

## 2014-12-17 DIAGNOSIS — Z043 Encounter for examination and observation following other accident: Secondary | ICD-10-CM | POA: Diagnosis present

## 2014-12-17 DIAGNOSIS — W050XXA Fall from non-moving wheelchair, initial encounter: Secondary | ICD-10-CM | POA: Insufficient documentation

## 2014-12-17 DIAGNOSIS — Y92128 Other place in nursing home as the place of occurrence of the external cause: Secondary | ICD-10-CM | POA: Diagnosis not present

## 2014-12-17 DIAGNOSIS — Z792 Long term (current) use of antibiotics: Secondary | ICD-10-CM | POA: Diagnosis not present

## 2014-12-17 DIAGNOSIS — Y9389 Activity, other specified: Secondary | ICD-10-CM | POA: Diagnosis not present

## 2014-12-17 DIAGNOSIS — M40209 Unspecified kyphosis, site unspecified: Secondary | ICD-10-CM | POA: Insufficient documentation

## 2014-12-17 DIAGNOSIS — F0390 Unspecified dementia without behavioral disturbance: Secondary | ICD-10-CM | POA: Diagnosis not present

## 2014-12-17 DIAGNOSIS — W19XXXA Unspecified fall, initial encounter: Secondary | ICD-10-CM

## 2014-12-17 NOTE — Discharge Instructions (Signed)
Ms. Crystal Davila refused xrays in the emergency department.  She was able to walk without difficulty and had no complaints of pain with walking.  Please get her rechecked if she begins to complain of pain or new concerning symptoms.     Fall Prevention and Home Safety Falls cause injuries and can affect all age groups. It is possible to use preventive measures to significantly decrease the likelihood of falls. There are many simple measures which can make your home safer and prevent falls. OUTDOORS  Repair cracks and edges of walkways and driveways.  Remove high doorway thresholds.  Trim shrubbery on the main path into your home.  Have good outside lighting.  Clear walkways of tools, rocks, debris, and clutter.  Check that handrails are not broken and are securely fastened. Both sides of steps should have handrails.  Have leaves, snow, and ice cleared regularly.  Use sand or salt on walkways during winter months.  In the garage, clean up grease or oil spills. BATHROOM  Install night lights.  Install grab bars by the toilet and in the tub and shower.  Use non-skid mats or decals in the tub or shower.  Place a plastic non-slip stool in the shower to sit on, if needed.  Keep floors dry and clean up all water on the floor immediately.  Remove soap buildup in the tub or shower on a regular basis.  Secure bath mats with non-slip, double-sided rug tape.  Remove throw rugs and tripping hazards from the floors. BEDROOMS  Install night lights.  Make sure a bedside light is easy to reach.  Do not use oversized bedding.  Keep a telephone by your bedside.  Have a firm chair with side arms to use for getting dressed.  Remove throw rugs and tripping hazards from the floor. KITCHEN  Keep handles on pots and pans turned toward the center of the stove. Use back burners when possible.  Clean up spills quickly and allow time for drying.  Avoid walking on wet floors.  Avoid hot  utensils and knives.  Position shelves so they are not too high or low.  Place commonly used objects within easy reach.  If necessary, use a sturdy step stool with a grab bar when reaching.  Keep electrical cables out of the way.  Do not use floor polish or wax that makes floors slippery. If you must use wax, use non-skid floor wax.  Remove throw rugs and tripping hazards from the floor. STAIRWAYS  Never leave objects on stairs.  Place handrails on both sides of stairways and use them. Fix any loose handrails. Make sure handrails on both sides of the stairways are as long as the stairs.  Check carpeting to make sure it is firmly attached along stairs. Make repairs to worn or loose carpet promptly.  Avoid placing throw rugs at the top or bottom of stairways, or properly secure the rug with carpet tape to prevent slippage. Get rid of throw rugs, if possible.  Have an electrician put in a light switch at the top and bottom of the stairs. OTHER FALL PREVENTION TIPS  Wear low-heel or rubber-soled shoes that are supportive and fit well. Wear closed toe shoes.  When using a stepladder, make sure it is fully opened and both spreaders are firmly locked. Do not climb a closed stepladder.  Add color or contrast paint or tape to grab bars and handrails in your home. Place contrasting color strips on first and last steps.  Learn and  use mobility aids as needed. Install an electrical emergency response system.  Turn on lights to avoid dark areas. Replace light bulbs that burn out immediately. Get light switches that glow.  Arrange furniture to create clear pathways. Keep furniture in the same place.  Firmly attach carpet with non-skid or double-sided tape.  Eliminate uneven floor surfaces.  Select a carpet pattern that does not visually hide the edge of steps.  Be aware of all pets. OTHER HOME SAFETY TIPS  Set the water temperature for 120 F (48.8 C).  Keep emergency numbers on  or near the telephone.  Keep smoke detectors on every level of the home and near sleeping areas. Document Released: 11/04/2002 Document Revised: 05/15/2012 Document Reviewed: 02/03/2012 [facility] Patient Information 2015 [place], [place]. This information is not intended to replace advice given to you by your health care provider. Make sure you discuss any questions you have with your health care provider.

## 2014-12-17 NOTE — ED Provider Notes (Signed)
CSN: [id number]     Arrival date & time 12/17/14  7829 History   First MD Initiated Contact with Patient 12/17/14 1009     No chief complaint on file.    Patient is a 69 y.o. female presenting with fall. The history is provided by the patient, the nursing home and the EMS personnel. No language interpreter was used.  Fall   Crystal Davila presents for evaluation of injuries following a fall.  Level V caveat due to dementia.  Pt complains of back pain in the emergency department.  She cannot describe the pain or narrow down location of pain.  Nursing home report patient was seen sliding out of her wheelchair and hit her head.  Past Medical History  Diagnosis Date  . Dementia    Past Surgical History  Procedure Laterality Date  . Orif wrist fracture Bilateral 02/03/2013    Procedure: OPEN REDUCTION INTERNAL FIXATION (ORIF) RIGHT WRIST FRACTURE,  CLOSED REDUCTION LEFT WRIST;  Surgeon: Johnette Abraham, MD;  Location: [facility] OR;  Service: Plastics;  Laterality: Bilateral;   No family history on file. History  Substance Use Topics  . Smoking status: Never Smoker   . Smokeless tobacco: Never Used  . Alcohol Use: No   OB History    No data available     Review of Systems  All other systems reviewed and are negative.     Allergies  Tamiflu  Home Medications   Prior to Admission medications   Medication Sig Start Date End Date Taking? Authorizing Provider  acetaminophen (TYLENOL) 500 MG tablet Take 500 mg by mouth every 4 (four) hours as needed for mild pain or fever.    Historical Provider, MD  alum & mag hydroxide-simeth (MAALOX/MYLANTA) 200-200-20 MG/5ML suspension Take 30 mLs by mouth 4 (four) times daily as needed for indigestion or heartburn.    Historical Provider, MD  cephALEXin (KEFLEX) 500 MG capsule Take 1 capsule (500 mg total) by mouth 2 (two) times daily. 11/01/14   Nicole Pisciotta, PA-C  divalproex (DEPAKOTE ER) 250 MG 24 hr tablet Take 250 mg by mouth 2 (two) times daily.     Historical Provider, MD  donepezil (ARICEPT) 10 MG tablet Take 10 mg by mouth at bedtime.     Historical Provider, MD  escitalopram (LEXAPRO) 20 MG tablet Take 20 mg by mouth daily.    Historical Provider, MD  guaifenesin (ROBITUSSIN) 100 MG/5ML syrup Take 200 mg by mouth every 6 (six) hours as needed for cough.    Historical Provider, MD  loperamide (IMODIUM) 2 MG capsule Take 2 mg by mouth as needed for diarrhea or loose stools.    Historical Provider, MD  magnesium hydroxide (MILK OF MAGNESIA) 400 MG/5ML suspension Take 30 mLs by mouth at bedtime as needed for mild constipation.    Historical Provider, MD  neomycin-bacitracin-polymyxin (NEOSPORIN) ointment Apply 1 application topically as needed for wound care. apply to eye    Historical Provider, MD  NYSTATIN, TOPICAL, POWD Apply 1 application topically 3 (three) times daily as needed (to affected areas).    Historical Provider, MD  temazepam (RESTORIL) 7.5 MG capsule Take 7.5 mg by mouth at bedtime as needed for sleep.    Historical Provider, MD   BP 127/88 mmHg  Pulse 98  Temp(Src) 98 F (36.7 C) (Oral)  Resp 21  SpO2 99% Physical Exam  Constitutional: She appears well-developed and well-nourished.  HENT:  Head: Normocephalic and atraumatic.  Cardiovascular: Normal rate and regular rhythm.  No murmur heard. Pulmonary/Chest: Effort normal and breath sounds normal. No respiratory distress.  Abdominal: Soft. There is no tenderness. There is no rebound and no guarding.  Musculoskeletal: She exhibits no edema or tenderness.  Kyphosis.  No discrete T or L spine tenderness.  No hip or extremity tenderness  Neurological: She is alert.  Disoriented to place and time.    Skin: Skin is warm and dry.  Psychiatric: She has a normal mood and affect. Her behavior is normal.  Nursing note and vitals reviewed.   ED Course  Procedures (including critical care time) Labs Review Labs Reviewed - No data to display  Imaging Review No  results found.   EKG Interpretation None      MDM   Final diagnoses:  Fall    Patient here for evaluation of injuries following a fall, there is no history of loss of consciousness. Patient denies any headache. There is no evidence of head trauma on exam. Attempted to order CT head as well as plain films of the back and patient refuses any imaging. It is very unlikely that patient has significant C-spine injury or head injury based on history, examination, and symptoms. Patient is able to ambulate without difficulty in the department. Patient discharged back to nursing facility for further management.     Tilden FossaElizabeth Tori Dattilio, MD 12/17/14 1501

## 2014-12-17 NOTE — ED Notes (Signed)
Pt refusing xray and CT. Dr. Madilyn Hookees notified.

## 2014-12-17 NOTE — ED Notes (Signed)
[facility] Witnessed sliding out of chair and hit head. Pt does not complain of any pain.

## 2014-12-17 NOTE — ED Notes (Signed)
Tried to calm down with food and turning the tv on. She keeps trying to jump out of bed.

## 2014-12-28 ENCOUNTER — Encounter (HOSPITAL_COMMUNITY): Payer: Self-pay | Admitting: Emergency Medicine

## 2014-12-28 ENCOUNTER — Emergency Department (HOSPITAL_COMMUNITY)
Admission: EM | Admit: 2014-12-28 | Discharge: 2014-12-28 | Disposition: A | Discharge status: Home / Self Care | Payer: Medicare Other | Attending: Emergency Medicine | Admitting: Emergency Medicine

## 2014-12-28 DIAGNOSIS — Z043 Encounter for examination and observation following other accident: Secondary | ICD-10-CM | POA: Insufficient documentation

## 2014-12-28 DIAGNOSIS — Y9389 Activity, other specified: Secondary | ICD-10-CM | POA: Diagnosis not present

## 2014-12-28 DIAGNOSIS — W07XXXA Fall from chair, initial encounter: Secondary | ICD-10-CM | POA: Insufficient documentation

## 2014-12-28 DIAGNOSIS — Z792 Long term (current) use of antibiotics: Secondary | ICD-10-CM | POA: Diagnosis not present

## 2014-12-28 DIAGNOSIS — Z79899 Other long term (current) drug therapy: Secondary | ICD-10-CM | POA: Insufficient documentation

## 2014-12-28 DIAGNOSIS — Y998 Other external cause status: Secondary | ICD-10-CM | POA: Insufficient documentation

## 2014-12-28 DIAGNOSIS — Z7952 Long term (current) use of systemic steroids: Secondary | ICD-10-CM | POA: Diagnosis not present

## 2014-12-28 DIAGNOSIS — Y9289 Other specified places as the place of occurrence of the external cause: Secondary | ICD-10-CM | POA: Diagnosis not present

## 2014-12-28 DIAGNOSIS — F0390 Unspecified dementia without behavioral disturbance: Secondary | ICD-10-CM | POA: Insufficient documentation

## 2014-12-28 NOTE — Discharge Instructions (Signed)
It was our pleasure to provide your ER care today - we hope that you feel better.  Fall precautions.  Follow up with primary care doctor this coming week.  Return to ER if worse, new symptoms, fevers, severe pain, other concern.     Fall Prevention and Home Safety Falls cause injuries and can affect all age groups. It is possible to prevent falls.  HOW TO PREVENT FALLS  Wear shoes with rubber soles that do not have an opening for your toes.  Keep the inside and outside of your house well lit.  Use night lights throughout your home.  Remove clutter from floors.  Clean up floor spills.  Remove throw rugs or fasten them to the floor with carpet tape.  Do not place electrical cords across pathways.  Put grab bars by your tub, shower, and toilet. Do not use towel bars as grab bars.  Put handrails on both sides of the stairway. Fix loose handrails.  Do not climb on stools or stepladders, if possible.  Do not wax your floors.  Repair uneven or unsafe sidewalks, walkways, or stairs.  Keep items you use a lot within reach.  Be aware of pets.  Keep emergency numbers next to the telephone.  Put smoke detectors in your home and near bedrooms. Ask your doctor what other things you can do to prevent falls. Document Released: 09/10/2009 Document Revised: 05/15/2012 Document Reviewed: 02/14/2012 [facility]Exit[facility] Patient Information 2015 [place]Care, [place]. This information is not intended to replace advice given to you by your health care provider. Make sure you discuss any questions you have with your health care provider.

## 2014-12-28 NOTE — ED Notes (Signed)
Severe dementia; wandering in hall. Will not stay in room. Called for Recruitment consultantsafety sitter.

## 2014-12-28 NOTE — ED Notes (Signed)
CALLED [facility] FOR TRANSPORT °

## 2014-12-28 NOTE — ED Notes (Signed)
[facility] Watching [facility], slid off the couch onto the floor. No injury noted, denies pain. VSS. Per EMS, they sent her per facility policy. Hx of Alzheimers.

## 2014-12-28 NOTE — ED Notes (Signed)
[facility] arrived for transport 

## 2014-12-28 NOTE — ED Provider Notes (Signed)
CSN: [id number]     Arrival date & time 12/28/14  1000 History   First MD Initiated Contact with Patient 12/28/14 1007     Chief Complaint  Patient presents with  . Fall     (Consider location/radiation/quality/duration/timing/severity/associated sxs/prior Treatment) Patient is a 69 y.o. female presenting with fall. The history is provided by the patient and the EMS personnel. The history is limited by the condition of the patient.  Fall Pertinent negatives include no chest pain, no abdominal pain, no headaches and no shortness of breath.  pt with hx dementia, presents from [facility] via [facility], after having been noted to slide/fall off couch onto floor. Pt denies pain or injury, ems states per facility policy, as fall, needed to be sent to ER.   Recent health reported as c/w baseline. No fevers. No anticoagulant use. Pt denies headache. No neck or back pain. Limited historian, dementia - level 5 caveat.     Past Medical History  Diagnosis Date  . Dementia    Past Surgical History  Procedure Laterality Date  . Orif wrist fracture Bilateral 02/03/2013    Procedure: OPEN REDUCTION INTERNAL FIXATION (ORIF) RIGHT WRIST FRACTURE,  CLOSED REDUCTION LEFT WRIST;  Surgeon: Johnette AbrahamHarrill C Coley, MD;  Location: [facility] OR;  Service: Plastics;  Laterality: Bilateral;   History reviewed. No pertinent family history. History  Substance Use Topics  . Smoking status: Never Smoker   . Smokeless tobacco: Never Used  . Alcohol Use: No   OB History    No data available     Review of Systems  Unable to perform ROS: Dementia  Constitutional: Negative for fever.  HENT: Negative for nosebleeds.   Eyes: Negative for redness.  Respiratory: Negative for cough and shortness of breath.   Cardiovascular: Negative for chest pain.  Gastrointestinal: Negative for nausea, vomiting and abdominal pain.  Genitourinary: Negative for flank pain.  Musculoskeletal: Negative for back pain and neck pain.  Skin: Negative for wound.   Neurological: Negative for weakness, numbness and headaches.  Hematological: Does not bruise/bleed easily.  Psychiatric/Behavioral: Negative for agitation.      Allergies  Tamiflu  Home Medications   Prior to Admission medications   Medication Sig Start Date End Date Taking? Authorizing Provider  acetaminophen (TYLENOL) 500 MG tablet Take 500 mg by mouth See admin instructions. Takes 1 tablet at bedtime scheduled then every 4 hours as needed for pain   Yes Historical Provider, MD  Cranberry 500 MG CAPS Take 1,000 mg by mouth daily.   Yes Historical Provider, MD  diphenhydrAMINE (BENADRYL) 25 MG tablet Take 25 mg by mouth at bedtime.   Yes Historical Provider, MD  divalproex (DEPAKOTE SPRINKLE) 125 MG capsule Take 250 mg by mouth 3 (three) times daily.   Yes Historical Provider, MD  donepezil (ARICEPT) 10 MG tablet Take 10 mg by mouth at bedtime.    Yes Historical Provider, MD  escitalopram (LEXAPRO) 20 MG tablet Take 20 mg by mouth daily.   Yes Historical Provider, MD  loperamide (IMODIUM) 2 MG capsule Take 2 mg by mouth as needed for diarrhea or loose stools.   Yes Historical Provider, MD  NYSTATIN, TOPICAL, POWD Apply 1 application topically 3 (three) times daily as needed (to affected areas).   Yes Historical Provider, MD  Skin Protectants, Misc. (MINERIN) CREA Apply 1 application topically 2 (two) times daily.   Yes Historical Provider, MD  triamcinolone cream (KENALOG) 0.1 % Apply 1 application topically 2 (two) times daily.   Yes Historical  Provider, MD  alum & mag hydroxide-simeth (MAALOX/MYLANTA) 200-200-20 MG/5ML suspension Take 30 mLs by mouth 4 (four) times daily as needed for indigestion or heartburn.    Historical Provider, MD  cephALEXin (KEFLEX) 500 MG capsule Take 1 capsule (500 mg total) by mouth 2 (two) times daily. Patient not taking: Reported on 12/17/2014 11/01/14   Joni Reining Pisciotta, PA-C  guaifenesin (ROBITUSSIN) 100 MG/5ML syrup Take 200 mg by mouth every 6 (six)  hours as needed for cough.    Historical Provider, MD  magnesium hydroxide (MILK OF MAGNESIA) 400 MG/5ML suspension Take 30 mLs by mouth at bedtime as needed for mild constipation.    Historical Provider, MD  neomycin-bacitracin-polymyxin (NEOSPORIN) ointment Apply 1 application topically as needed for wound care. apply to eye    Historical Provider, MD  temazepam (RESTORIL) 7.5 MG capsule Take 7.5 mg by mouth at bedtime as needed for sleep.    Historical Provider, MD   BP 103/57 mmHg  Pulse 67  Temp(Src) 98.2 F (36.8 C) (Oral)  Resp 20  SpO2 98% Physical Exam  Constitutional: She appears well-developed and well-nourished. No distress.  HENT:  Head: Atraumatic.  Nose: Nose normal.  Mouth/Throat: Oropharynx is clear and moist.  Eyes: Conjunctivae are normal. Pupils are equal, round, and reactive to light. No scleral icterus.  Neck: Normal range of motion. Neck supple. No tracheal deviation present.  Cardiovascular: Normal rate, regular rhythm, normal heart sounds and intact distal pulses.   Pulmonary/Chest: Effort normal and breath sounds normal. No respiratory distress. She exhibits no tenderness.  Abdominal: Soft. Normal appearance. She exhibits no distension. There is no tenderness.  Genitourinary:  No cva or flank tenderness.  Musculoskeletal: She exhibits no edema or tenderness.  Neurological: She is alert.  Awake and alert. Speech clear/fluent. Moves bil ext purposefully w good strength.   Skin: Skin is warm and dry. No rash noted.  Psychiatric: She has a normal mood and affect.  Nursing note and vitals reviewed.   ED Course  Procedures (including critical care time) Labs Review     MDM   Pt denies any pain or other c/o.  No apparent injury on exam.   Reviewed nursing notes and prior charts for additional history.   Pt continues to deny pain or injury, and is reportedly at baseline.  Pt currently appears stable for d/c.   Fall precautions, and return precautions  discussed.       Suzi Roots, MD 12/28/14 1022

## 2015-01-12 ENCOUNTER — Emergency Department (HOSPITAL_COMMUNITY): Payer: Medicare Other

## 2015-01-12 ENCOUNTER — Emergency Department (HOSPITAL_COMMUNITY)
Admission: EM | Admit: 2015-01-12 | Discharge: 2015-01-14 | Disposition: A | Discharge status: Other Acute Inpatient Hospital | Payer: Medicare Other | Attending: Emergency Medicine | Admitting: Emergency Medicine

## 2015-01-12 ENCOUNTER — Encounter (HOSPITAL_COMMUNITY): Payer: Self-pay | Admitting: *Deleted

## 2015-01-12 DIAGNOSIS — G309 Alzheimer's disease, unspecified: Secondary | ICD-10-CM | POA: Insufficient documentation

## 2015-01-12 DIAGNOSIS — F0280 Dementia in other diseases classified elsewhere without behavioral disturbance: Secondary | ICD-10-CM | POA: Diagnosis not present

## 2015-01-12 DIAGNOSIS — R4689 Other symptoms and signs involving appearance and behavior: Secondary | ICD-10-CM

## 2015-01-12 DIAGNOSIS — F911 Conduct disorder, childhood-onset type: Secondary | ICD-10-CM | POA: Diagnosis present

## 2015-01-12 DIAGNOSIS — F03918 Unspecified dementia, unspecified severity, with other behavioral disturbance: Secondary | ICD-10-CM | POA: Diagnosis present

## 2015-01-12 DIAGNOSIS — F0391 Unspecified dementia with behavioral disturbance: Secondary | ICD-10-CM

## 2015-01-12 DIAGNOSIS — Z79899 Other long term (current) drug therapy: Secondary | ICD-10-CM | POA: Insufficient documentation

## 2015-01-12 DIAGNOSIS — N390 Urinary tract infection, site not specified: Secondary | ICD-10-CM | POA: Diagnosis present

## 2015-01-12 DIAGNOSIS — F1310 Sedative, hypnotic or anxiolytic abuse, uncomplicated: Secondary | ICD-10-CM | POA: Insufficient documentation

## 2015-01-12 DIAGNOSIS — R443 Hallucinations, unspecified: Secondary | ICD-10-CM | POA: Diagnosis present

## 2015-01-12 DIAGNOSIS — Z792 Long term (current) use of antibiotics: Secondary | ICD-10-CM | POA: Diagnosis not present

## 2015-01-12 HISTORY — DX: Dementia in other diseases classified elsewhere, unspecified severity, without behavioral disturbance, psychotic disturbance, mood disturbance, and anxiety: F02.80

## 2015-01-12 HISTORY — DX: Alzheimer's disease, unspecified: G30.9

## 2015-01-12 LAB — COMPREHENSIVE METABOLIC PANEL
ALBUMIN: 3.2 g/dL — AB (ref 3.5–5.2)
ALT: 13 U/L (ref 0–35)
AST: 18 U/L (ref 0–37)
Alkaline Phosphatase: 40 U/L (ref 39–117)
Anion gap: 6 (ref 5–15)
BUN: 13 mg/dL (ref 6–23)
CALCIUM: 8.6 mg/dL (ref 8.4–10.5)
CO2: 29 mmol/L (ref 19–32)
CREATININE: 0.57 mg/dL (ref 0.50–1.10)
Chloride: 106 mmol/L (ref 96–112)
GFR calc Af Amer: >90 mL/min (ref >90)
GFR calc non Af Amer: >90 mL/min (ref >90)
GLUCOSE: 88 mg/dL (ref 70–99)
Potassium: 3.8 mmol/L (ref 3.5–5.1)
SODIUM: 141 mmol/L (ref 135–145)
TOTAL PROTEIN: 6.1 g/dL (ref 6.0–8.3)
Total Bilirubin: 0.5 mg/dL (ref 0.3–1.2)

## 2015-01-12 LAB — ETHANOL: Alcohol, Ethyl (B): <5 mg/dL (ref 0–9)

## 2015-01-12 LAB — SALICYLATE LEVEL: Salicylate Lvl: <4 mg/dL (ref 2.8–20.0)

## 2015-01-12 LAB — CBC
HCT: 37.2 % (ref 36.0–46.0)
Hemoglobin: 12.1 g/dL (ref 12.0–15.0)
MCH: 31 pg (ref 26.0–34.0)
MCHC: 32.5 g/dL (ref 30.0–36.0)
MCV: 95.4 fL (ref 78.0–100.0)
Platelets: 198 10*3/uL (ref 150–400)
RBC: 3.9 MIL/uL (ref 3.87–5.11)
RDW: 12.6 % (ref 11.5–15.5)
WBC: 4.9 10*3/uL (ref 4.0–10.5)

## 2015-01-12 LAB — ACETAMINOPHEN LEVEL: Acetaminophen (Tylenol), Serum: <10 ug/mL — ABNORMAL LOW (ref 10–30)
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
## 2015-01-12 NOTE — ED Notes (Signed)
Pt is unable to give a urine sample at this time. 
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
## 2015-01-12 NOTE — ED Notes (Signed)
Bed: [id number] Expected date: 01/12/15 Expected time: 10:24 PM Means of arrival: Ambulance Comments: Aggressive behavior

## 2015-01-13 DIAGNOSIS — F03918 Unspecified dementia, unspecified severity, with other behavioral disturbance: Secondary | ICD-10-CM | POA: Diagnosis present

## 2015-01-13 DIAGNOSIS — R443 Hallucinations, unspecified: Secondary | ICD-10-CM

## 2015-01-13 DIAGNOSIS — R4689 Other symptoms and signs involving appearance and behavior: Secondary | ICD-10-CM | POA: Insufficient documentation

## 2015-01-13 DIAGNOSIS — F911 Conduct disorder, childhood-onset type: Secondary | ICD-10-CM | POA: Diagnosis not present

## 2015-01-13 DIAGNOSIS — N390 Urinary tract infection, site not specified: Secondary | ICD-10-CM | POA: Diagnosis present

## 2015-01-13 DIAGNOSIS — F0391 Unspecified dementia with behavioral disturbance: Secondary | ICD-10-CM

## 2015-01-13 LAB — URINALYSIS, ROUTINE W REFLEX MICROSCOPIC
Bilirubin Urine: NEGATIVE
GLUCOSE, UA: NEGATIVE mg/dL
Hgb urine dipstick: NEGATIVE
KETONES UR: NEGATIVE mg/dL
NITRITE: NEGATIVE
Protein, ur: NEGATIVE mg/dL
Specific Gravity, Urine: 1.004 — ABNORMAL LOW (ref 1.005–1.030)
Urobilinogen, UA: 0.2 mg/dL (ref 0.0–1.0)
pH: 7.5 (ref 5.0–8.0)

## 2015-01-13 LAB — URINE MICROSCOPIC-ADD ON

## 2015-01-13 LAB — VALPROIC ACID LEVEL: Valproic Acid Lvl: 41.7 ug/mL — ABNORMAL LOW (ref 50.0–100.0)

## 2015-01-13 LAB — RAPID URINE DRUG SCREEN, HOSP PERFORMED
Amphetamines: NOT DETECTED
BENZODIAZEPINES: POSITIVE — AB
Barbiturates: NOT DETECTED
COCAINE: NOT DETECTED
OPIATES: NOT DETECTED
TETRAHYDROCANNABINOL: NOT DETECTED

## 2015-01-13 MED ORDER — ESCITALOPRAM OXALATE 10 MG PO TABS
20.0000 mg | ORAL_TABLET | Freq: Every day | ORAL | Status: DC
  Administered 2015-01-13 – 2015-01-14 (×2): 20 mg via ORAL
  Filled 2015-01-13 (×2): qty 2

## 2015-01-13 MED ORDER — TEMAZEPAM 7.5 MG PO CAPS
7.5000 mg | ORAL_CAPSULE | Freq: Every evening | ORAL | Status: DC | PRN
Start: 2015-01-13 — End: 2015-01-14
  Administered 2015-01-13: 7.5 mg via ORAL
  Filled 2015-01-13: qty 1

## 2015-01-13 MED ORDER — DONEPEZIL HCL 10 MG PO TABS
10.0000 mg | ORAL_TABLET | Freq: Every day | ORAL | Status: DC
  Administered 2015-01-13 (×2): 10 mg via ORAL
  Filled 2015-01-13 (×3): qty 1

## 2015-01-13 MED ORDER — DIVALPROEX SODIUM 125 MG PO CPSP
250.0000 mg | ORAL_CAPSULE | Freq: Three times a day (TID) | ORAL | Status: DC
  Administered 2015-01-13 – 2015-01-14 (×5): 250 mg via ORAL
  Filled 2015-01-13 (×8): qty 2

## 2015-01-13 MED ORDER — CEFTRIAXONE SODIUM 1 G IJ SOLR
1.0000 g | Freq: Once | INTRAMUSCULAR | Status: DC
  Filled 2015-01-13: qty 10

## 2015-01-13 MED ORDER — QUETIAPINE FUMARATE 25 MG PO TABS
25.0000 mg | ORAL_TABLET | Freq: Every day | ORAL | Status: DC
  Administered 2015-01-13: 25 mg via ORAL
  Filled 2015-01-13: qty 1

## 2015-01-13 MED ORDER — ACETAMINOPHEN 500 MG PO TABS: 500.0000 mg | ORAL_TABLET | ORAL | Status: DC | PRN

## 2015-01-13 MED ORDER — CEPHALEXIN 500 MG PO CAPS
500.0000 mg | ORAL_CAPSULE | Freq: Three times a day (TID) | ORAL | Status: DC
  Administered 2015-01-13 – 2015-01-14 (×5): 500 mg via ORAL
  Filled 2015-01-13 (×5): qty 1

## 2015-01-13 NOTE — BH Assessment (Signed)
[facility] Assessment Progress Note   This clinician attempted to assess patient but she was unresponsive to having her name called, etc.  Patient kept her eyes closed and appears to be sleeping.  Clinician did talk to Filbert Bertholdavid Dansie, PA regarding taking out the TTS consult request until patient is more alert.  He agreed to this.  Also suggested that social work be consulted in case [facility] does not take her back.  TTS consult order may be put back in later in the morning.
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
## 2015-01-13 NOTE — BH Assessment (Signed)
CSW (K.Reed) will inform TTS (K>Chesire) of the consult.  
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
## 2015-01-13 NOTE — BH Assessment (Signed)
[facility] Assessment Progress Note  The following facilities have been contacted in an effort to place this pt, with results as noted:  Beds available, information faxed, [facility] pending[facility]  [facility]  [facility]  Doylene Canninghomas Taysom Glymph, [place] Triage Specialist 01/13/2015 @ 16:12

## 2015-01-14 DIAGNOSIS — F911 Conduct disorder, childhood-onset type: Secondary | ICD-10-CM | POA: Diagnosis not present

## 2015-01-14 DIAGNOSIS — N390 Urinary tract infection, site not specified: Secondary | ICD-10-CM | POA: Insufficient documentation

## 2015-01-14 NOTE — ED Notes (Signed)
Pt appears to be sleeping, NAD

## 2015-01-14 NOTE — Progress Notes (Signed)
[facility], however pt son coming to pick up patient to return [facility]. TTS to follow up with pt son regarding disposition. At this time, pt recommended to [facility].   Byrd HesselbachKristen Zeven Kocak, LCSW [phone]  ED CSW 01/14/2015 11:13 AM

## 2015-01-14 NOTE — ED Notes (Signed)
Pt appears to be asleep. NAD

## 2015-01-14 NOTE — BH Assessment (Signed)
[facility] Assessment Progress Note  At 09:51 I received a call from Crystal Davila at [facility].  Pt has been accepted to their facility by Crystal FosterBeverly Jones, MD to Rm [phone]; report is to be called to [phone].  However, they want pt to be placed under IVC.  After staffing this with Thedore MinsMojeed Akintayo, MD, it has been decided that this should be discussed with her adult son, Crystal Davila, before making a final decision.  IVC paperwork has been prepared but has not been sent to the magistrate.  TTS staff has made several attempts to reach the son by telephone without success, however, he routinely visits his mother at the ED around 12:30.  Pt's nurse has been asked to call this writer at [phone] when the son arrives.  Final disposition is pending at this time.  Crystal Canninghomas Aliea Bobe, MA Triage Specialist 01/14/2015

## 2015-01-14 NOTE — ED Notes (Signed)
Pt took medications without difficulty. Pt has breakfast tray in front on bedside table, pt slowly eating while falling asleep.
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
## 2015-01-14 NOTE — Progress Notes (Signed)
Recommendation from quality collaborative meeting on 01/13/15: (Wellsite geologistmedical director) add nmenda, depakote less then 500 mg, and seroquel 50 mg qhs. Continue lexapro and aricept

## 2015-01-16 LAB — URINE CULTURE
Colony Count: >=100000
Special Requests: NORMAL
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
## 2018-05-28 DEATH — deceased
